# Patient Record
Sex: Male | Born: 1995 | Race: White | Hispanic: No | Marital: Married | Smoking: Current some day smoker
Health system: Southern US, Community
[De-identification: ages and names within clinical notes are randomized; demographics above are authoritative.]

## PROBLEM LIST (undated history)

## (undated) DIAGNOSIS — J45909 Unspecified asthma, uncomplicated: Secondary | ICD-10-CM

## (undated) DIAGNOSIS — R079 Chest pain, unspecified: Secondary | ICD-10-CM

## (undated) DIAGNOSIS — Z87442 Personal history of urinary calculi: Secondary | ICD-10-CM

## (undated) DIAGNOSIS — F419 Anxiety disorder, unspecified: Secondary | ICD-10-CM

## (undated) HISTORY — PX: TONSILLECTOMY AND ADENOIDECTOMY: SUR1326

## (undated) HISTORY — PX: TONSILLECTOMY: SUR1361

---
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
## 2016-01-22 ENCOUNTER — Emergency Department (HOSPITAL_COMMUNITY)
Admission: EM | Admit: 2016-01-22 | Discharge: 2016-01-22 | Disposition: A | Discharge status: Home / Self Care | Payer: Medicaid Other | Attending: Emergency Medicine | Admitting: Emergency Medicine

## 2016-01-22 ENCOUNTER — Encounter (HOSPITAL_COMMUNITY): Payer: Self-pay | Admitting: Emergency Medicine

## 2016-01-22 DIAGNOSIS — R062 Wheezing: Secondary | ICD-10-CM

## 2016-01-22 DIAGNOSIS — J069 Acute upper respiratory infection, unspecified: Secondary | ICD-10-CM | POA: Insufficient documentation

## 2016-01-22 DIAGNOSIS — F17200 Nicotine dependence, unspecified, uncomplicated: Secondary | ICD-10-CM | POA: Insufficient documentation

## 2016-01-22 DIAGNOSIS — R05 Cough: Secondary | ICD-10-CM | POA: Diagnosis present

## 2016-01-22 DIAGNOSIS — B9789 Other viral agents as the cause of diseases classified elsewhere: Secondary | ICD-10-CM

## 2016-01-22 MED ORDER — ALBUTEROL SULFATE HFA 108 (90 BASE) MCG/ACT IN AERS
2.0000 | INHALATION_SPRAY | Freq: Once | RESPIRATORY_TRACT | Status: AC
  Administered 2016-01-22: 2 via RESPIRATORY_TRACT
  Filled 2016-01-22: qty 6.7

## 2016-01-22 MED ORDER — BENZONATATE 100 MG PO CAPS
200.0000 mg | ORAL_CAPSULE | Freq: Once | ORAL | Status: AC
  Administered 2016-01-22: 200 mg via ORAL
  Filled 2016-01-22: qty 2

## 2016-01-22 MED ORDER — BENZONATATE 100 MG PO CAPS: 200.0000 mg | ORAL_CAPSULE | Freq: Three times a day (TID) | ORAL | Status: DC | PRN

## 2016-01-22 NOTE — Discharge Instructions (Signed)
Upper Respiratory Infection, Adult Most upper respiratory infections (URIs) are a viral infection of the air passages leading to the lungs. A URI affects the nose, throat, and upper air passages. The most common type of URI is nasopharyngitis and is typically referred to as "the common cold." URIs run their course and usually go away on their own. Most of the time, a URI does not require medical attention, but sometimes a bacterial infection in the upper airways can follow a viral infection. This is called a secondary infection. Sinus and middle ear infections are common types of secondary upper respiratory infections. Bacterial pneumonia can also complicate a URI. A URI can worsen asthma and chronic obstructive pulmonary disease (COPD). Sometimes, these complications can require emergency medical care and may be life threatening.  CAUSES Almost all URIs are caused by viruses. A virus is a type of germ and can spread from one person to another.  RISKS FACTORS You may be at risk for a URI if:   You smoke.   You have chronic heart or lung disease.  You have a weakened defense (immune) system.   You are very young or very old.   You have nasal allergies or asthma.  You work in crowded or poorly ventilated areas.  You work in health care facilities or schools. SIGNS AND SYMPTOMS  Symptoms typically develop 2-3 days after you come in contact with a cold virus. Most viral URIs last 7-10 days. However, viral URIs from the influenza virus (flu virus) can last 14-18 days and are typically more severe. Symptoms may include:   Runny or stuffy (congested) nose.   Sneezing.   Cough.   Sore throat.   Headache.   Fatigue.   Fever.   Loss of appetite.   Pain in your forehead, behind your eyes, and over your cheekbones (sinus pain).  Muscle aches.  DIAGNOSIS  Your health care provider may diagnose a URI by:  Physical exam.  Tests to check that your symptoms are not due to  another condition such as:  Strep throat.  Sinusitis.  Pneumonia.  Asthma. TREATMENT  A URI goes away on its own with time. It cannot be cured with medicines, but medicines may be prescribed or recommended to relieve symptoms. Medicines may help:  Reduce your fever.  Reduce your cough.  Relieve nasal congestion. HOME CARE INSTRUCTIONS   Take medicines only as directed by your health care provider.   Gargle warm saltwater or take cough drops to comfort your throat as directed by your health care provider.  Use a warm mist humidifier or inhale steam from a shower to increase air moisture. This may make it easier to breathe.  Drink enough fluid to keep your urine clear or pale yellow.   Eat soups and other clear broths and maintain good nutrition.   Rest as needed.   Return to work when your temperature has returned to normal or as your health care provider advises. You may need to stay home longer to avoid infecting others. You can also use a face mask and careful hand washing to prevent spread of the virus.  Increase the usage of your inhaler if you have asthma.   Do not use any tobacco products, including cigarettes, chewing tobacco, or electronic cigarettes. If you need help quitting, ask your health care provider. PREVENTION  The best way to protect yourself from getting a cold is to practice good hygiene.   Avoid oral or hand contact with people with cold  symptoms.   Wash your hands often if contact occurs.  There is no clear evidence that vitamin C, vitamin E, echinacea, or exercise reduces the chance of developing a cold. However, it is always recommended to get plenty of rest, exercise, and practice good nutrition.  SEEK MEDICAL CARE IF:   You are getting worse rather than better.   Your symptoms are not controlled by medicine.   You have chills.  You have worsening shortness of breath.  You have brown or red mucus.  You have yellow or brown nasal  discharge.  You have pain in your face, especially when you bend forward.  You have a fever.  You have swollen neck glands.  You have pain while swallowing.  You have white areas in the back of your throat. SEEK IMMEDIATE MEDICAL CARE IF:   You have severe or persistent:  Headache.  Ear pain.  Sinus pain.  Chest pain.  You have chronic lung disease and any of the following:  Wheezing.  Prolonged cough.  Coughing up blood.  A change in your usual mucus.  You have a stiff neck.  You have changes in your:  Vision.  Hearing.  Thinking.  Mood. MAKE SURE YOU:   Understand these instructions.  Will watch your condition.  Will get help right away if you are not doing well or get worse.   This information is not intended to replace advice given to you by your health care provider. Make sure you discuss any questions you have with your health care provider.   Document Released: 04/02/2001 Document Revised: 02/21/2015 Document Reviewed: 01/12/2014 Elsevier Interactive Patient Education [employer]2016 [employer]    As discussed, you may use 2 puffs of your inhaler every 4 hours if you are coughing, short of breath or wheezing.

## 2016-01-22 NOTE — ED Notes (Signed)
Pt states he has been having productive cough x one week.

## 2016-01-24 NOTE — ED Provider Notes (Signed)
CSN: [id number]     Arrival date & time 01/22/16  2044 History   First MD Initiated Contact with Patient 01/22/16 2109     Chief Complaint  Patient presents with  . Cough     (Consider location/radiation/quality/duration/timing/severity/associated sxs/prior Treatment) The history is provided by the patient.   Russell Huynh is a 20 y.o. male presenting with a 5 day history of uri type symptoms which includes nasal congestion with clear rhinorrhea, post nasal drip and cough which has been productive of clear sputum.  Symptoms do not include shortness of breath, chest pain, nausea, vomiting or diarrhea. He denies fevers or chills.  His cough has been worse at night, making it difficult to sleep and is frequently triggered by a dry tickle sensation in his check.  He denies post tussive emesis.  The patient has taken cough drops and otc cough syrups with no relief.      Past Medical History  Diagnosis Date  . Chest pain    Past Surgical History  Procedure Laterality Date  . Tonsillectomy     No family history on file. Social History  Substance Use Topics  . Smoking status: Current Some Day Smoker  . Smokeless tobacco: None  . Alcohol Use: No    Review of Systems  Constitutional: Negative for fever and chills.  HENT: Positive for congestion and rhinorrhea. Negative for ear pain, sinus pressure, sore throat, trouble swallowing and voice change.   Eyes: Negative for discharge.  Respiratory: Positive for cough. Negative for shortness of breath, wheezing and stridor.   Cardiovascular: Negative for chest pain.  Gastrointestinal: Negative for abdominal pain.  Genitourinary: Negative.       Allergies  Pollen extract  Home Medications   Prior to Admission medications   Medication Sig Start Date End Date Taking? Authorizing Provider  acetaminophen-codeine (TYLENOL #3) 300-30 MG per tablet Take 1 tablet by mouth every 6 (six) hours as needed for moderate pain. 10/03/13   Burgess AmorJulie Lessa Huge,  PA-C  benzonatate (TESSALON) 100 MG capsule Take 2 capsules (200 mg total) by mouth 3 (three) times daily as needed. 01/22/16   Burgess AmorJulie Jule Schlabach, PA-C  ibuprofen (ADVIL,MOTRIN) 600 MG tablet Take 1 tablet (600 mg total) by mouth every 6 (six) hours as needed. 12/14/14   Loren Raceravid Yelverton, MD  oxyCODONE-acetaminophen (PERCOCET) 5-325 MG per tablet Take 2 tablets by mouth every 6 (six) hours as needed for severe pain. 10/30/13   Wayland SalinasJohn Bednar, MD   BP 118/74 mmHg  Pulse 82  Temp(Src) 97.9 F (36.6 C) (Oral)  Resp 20  Ht 5\' 9"  (1.753 m)  Wt 58.968 kg  BMI 19.19 kg/m2  SpO2 98% Physical Exam  Constitutional: He is oriented to person, place, and time. He appears well-developed and well-nourished.  HENT:  Head: Normocephalic and atraumatic.  Right Ear: Tympanic membrane and ear canal normal.  Left Ear: Tympanic membrane and ear canal normal.  Nose: Rhinorrhea present. No mucosal edema.  Mouth/Throat: Uvula is midline, oropharynx is clear and moist and mucous membranes are normal. No oropharyngeal exudate, posterior oropharyngeal edema, posterior oropharyngeal erythema or tonsillar abscesses.  Eyes: Conjunctivae are normal.  Cardiovascular: Normal rate, regular rhythm and normal heart sounds.   Pulmonary/Chest: Effort normal. No respiratory distress. He has wheezes in the right lower field and the left lower field. He has no rales.  Expiratory wheeze at bases, clear with cough.  Abdominal: Soft. There is no tenderness.  Musculoskeletal: Normal range of motion.  Neurological: He is alert and oriented  to person, place, and time.  Skin: Skin is warm and dry. No rash noted.  Psychiatric: He has a normal mood and affect.    ED Course  Procedures (including critical care time) Labs Review Labs Reviewed - No data to display  Imaging Review No results found. I have personally reviewed and evaluated these images and lab results as part of my medical decision-making.   EKG Interpretation None       MDM   Final diagnoses:  Viral URI with cough  Wheezing    Pt given tessalon and albuterol mdi while here.  Cough improved with no wheeze at recheck exam, lungs are clear at repeat exam, no indication for imaging at this time.  Prescribed tessalon and pt will take mdi home with spacer. Advised rest, increased fluids, prn f/u with pcp if sx persist or worsen.    Burgess Amor, PA-C 01/24/16 2322  Marily Memos, MD 01/25/16 [phone]

## 2018-05-14 ENCOUNTER — Emergency Department (HOSPITAL_COMMUNITY): Payer: Self-pay

## 2018-05-14 ENCOUNTER — Encounter (HOSPITAL_COMMUNITY): Payer: Self-pay

## 2018-05-14 ENCOUNTER — Emergency Department (HOSPITAL_COMMUNITY)
Admission: EM | Admit: 2018-05-14 | Discharge: 2018-05-14 | Disposition: A | Discharge status: Home / Self Care | Payer: Self-pay | Attending: Emergency Medicine | Admitting: Emergency Medicine

## 2018-05-14 ENCOUNTER — Other Ambulatory Visit: Payer: Self-pay

## 2018-05-14 DIAGNOSIS — F17200 Nicotine dependence, unspecified, uncomplicated: Secondary | ICD-10-CM | POA: Insufficient documentation

## 2018-05-14 DIAGNOSIS — K529 Noninfective gastroenteritis and colitis, unspecified: Secondary | ICD-10-CM

## 2018-05-14 DIAGNOSIS — K5289 Other specified noninfective gastroenteritis and colitis: Secondary | ICD-10-CM | POA: Insufficient documentation

## 2018-05-14 LAB — CBC WITH DIFFERENTIAL/PLATELET
BASOS PCT: 0 %
Basophils Absolute: 0 10*3/uL (ref 0.0–0.1)
EOS ABS: 0.3 10*3/uL (ref 0.0–0.7)
Eosinophils Relative: 3 %
HEMATOCRIT: 47.8 % (ref 39.0–52.0)
Hemoglobin: 16.9 g/dL (ref 13.0–17.0)
Lymphocytes Relative: 29 %
Lymphs Abs: 2.6 10*3/uL (ref 0.7–4.0)
MCH: 32 pg (ref 26.0–34.0)
MCHC: 35.4 g/dL (ref 30.0–36.0)
MCV: 90.5 fL (ref 78.0–100.0)
MONO ABS: 0.5 10*3/uL (ref 0.1–1.0)
MONOS PCT: 5 %
NEUTROS ABS: 5.7 10*3/uL (ref 1.7–7.7)
Neutrophils Relative %: 63 %
Platelets: 144 10*3/uL — ABNORMAL LOW (ref 150–400)
RBC: 5.28 MIL/uL (ref 4.22–5.81)
RDW: 12.1 % (ref 11.5–15.5)
WBC: 9.1 10*3/uL (ref 4.0–10.5)

## 2018-05-14 LAB — COMPREHENSIVE METABOLIC PANEL
ALBUMIN: 4.3 g/dL (ref 3.5–5.0)
ALT: 13 U/L (ref 0–44)
ANION GAP: 8 (ref 5–15)
AST: 17 U/L (ref 15–41)
Alkaline Phosphatase: 56 U/L (ref 38–126)
BUN: 12 mg/dL (ref 6–20)
CALCIUM: 9.2 mg/dL (ref 8.9–10.3)
CO2: 27 mmol/L (ref 22–32)
Chloride: 110 mmol/L (ref 98–111)
Creatinine, Ser: 0.81 mg/dL (ref 0.61–1.24)
GFR calc Af Amer: >60 mL/min (ref >60)
GFR calc non Af Amer: >60 mL/min (ref >60)
GLUCOSE: 88 mg/dL (ref 70–99)
Potassium: 4 mmol/L (ref 3.5–5.1)
Sodium: 145 mmol/L (ref 135–145)
TOTAL PROTEIN: 7.1 g/dL (ref 6.5–8.1)
Total Bilirubin: 0.6 mg/dL (ref 0.3–1.2)

## 2018-05-14 LAB — LIPASE, BLOOD: Lipase: 32 U/L (ref 11–51)

## 2018-05-14 MED ORDER — METRONIDAZOLE 500 MG PO TABS
500.0000 mg | ORAL_TABLET | Freq: Once | ORAL | Status: AC
  Administered 2018-05-14: 500 mg via ORAL
  Filled 2018-05-14: qty 1

## 2018-05-14 MED ORDER — METRONIDAZOLE 500 MG PO TABS: 500.0000 mg | ORAL_TABLET | Freq: Two times a day (BID) | ORAL | 0 refills | Status: AC

## 2018-05-14 MED ORDER — CIPROFLOXACIN HCL 250 MG PO TABS
500.0000 mg | ORAL_TABLET | Freq: Once | ORAL | Status: AC
  Administered 2018-05-14: 500 mg via ORAL
  Filled 2018-05-14: qty 2

## 2018-05-14 MED ORDER — MELOXICAM 7.5 MG PO TABS: 15.0000 mg | ORAL_TABLET | Freq: Every day | ORAL | 0 refills | Status: DC

## 2018-05-14 MED ORDER — CIPROFLOXACIN HCL 500 MG PO TABS: 500.0000 mg | ORAL_TABLET | Freq: Two times a day (BID) | ORAL | 0 refills | Status: DC

## 2018-05-14 MED ORDER — IOPAMIDOL (ISOVUE-300) INJECTION 61%
100.0000 mL | Freq: Once | INTRAVENOUS | Status: AC | PRN
  Administered 2018-05-14: 100 mL via INTRAVENOUS

## 2018-05-14 MED ORDER — MORPHINE SULFATE (PF) 4 MG/ML IV SOLN
4.0000 mg | Freq: Once | INTRAVENOUS | Status: AC
  Administered 2018-05-14: 4 mg via INTRAVENOUS
  Filled 2018-05-14: qty 1

## 2018-05-14 NOTE — ED Triage Notes (Signed)
Pt is having lower right abdominal pain that has been hurting for 2 days. States he is having diarrhea that started today as well. Is having rebound tenderness. No fever or chills.

## 2018-05-14 NOTE — Discharge Instructions (Signed)
Your CT scan shows you have mild colitis - bowel inflammation - this can sometimes be related to a viral or bacterial infection.  Ciprofloxacin twice a day for 10 days, Flagyl twice a day for 10 days, Mobic as needed for pain  you will likely have ongoing diarrhea and discomfort for the next several days to a week.  Drink plenty of fluids to avoid getting dehydrated.  Emergency department for severe or worsening symptoms.

## 2018-05-14 NOTE — ED Provider Notes (Signed)
[facility] EMERGENCY DEPARTMENT Provider Note   CSN: [id number] Arrival date & time: 05/14/18  4098     History   Chief Complaint Chief Complaint  Patient presents with  . Abdominal Pain    HPI Russell Huynh is a 22 y.o. male.  HPI  The patient is a 22 year old male, he presents today with a complaint of right mid abdominal pain.  He states that this has been going on for approximately 5 days, it started on Sunday, it was gradual in onset but has been persistent throughout each of the last 5 days.  He notes it most intense in the morning when he gets up and gradually gets better throughout the day but is still persistent.  He states that when he is doing something like his job it does not bother him that much but when he stops doing something he notices the pain.  The pain is more on the right mid abdomen, sometimes lower, sometimes upper but is not associated with fevers, not associated with nausea and only this morning was it associated with changes in bowel habits with diarrhea which she describes as both loose and watery but nonbloody.  He has never had any abdominal pain, he has been eating and drinking per normal and has no postprandial pain.  The pain is never on the left side, there is no coughing shortness of breath or chest discomfort and no difficulty with urination including hematuria dysuria or frequency.  The patient has never had any pains like this in the past.  Past Medical History:  Diagnosis Date  . Chest pain     Patient Active Problem List   Diagnosis Date Noted  . Muscle weakness (generalized) 03/10/2014  . Pain in joint, lower leg 03/10/2014  . Stiffness of joint, not elsewhere classified, lower leg 03/10/2014  . Knee pain, bilateral 09/30/2013    Past Surgical History:  Procedure Laterality Date  . TONSILLECTOMY          Home Medications    Prior to Admission medications   Medication Sig Start Date End Date Taking? Authorizing Provider    acetaminophen-codeine (TYLENOL #3) 300-30 MG per tablet Take 1 tablet by mouth every 6 (six) hours as needed for moderate pain. 10/03/13   Burgess Amor, PA-C  benzonatate (TESSALON) 100 MG capsule Take 2 capsules (200 mg total) by mouth 3 (three) times daily as needed. 01/22/16   Burgess Amor, PA-C  ciprofloxacin (CIPRO) 500 MG tablet Take 1 tablet (500 mg total) by mouth every 12 (twelve) hours. 05/14/18   Eber Hong, MD  ibuprofen (ADVIL,MOTRIN) 600 MG tablet Take 1 tablet (600 mg total) by mouth every 6 (six) hours as needed. 12/14/14   [name], MD  meloxicam (MOBIC) 7.5 MG tablet Take 2 tablets (15 mg total) by mouth daily. 05/14/18   Eber Hong, MD  metroNIDAZOLE (FLAGYL) 500 MG tablet Take 1 tablet (500 mg total) by mouth 2 (two) times daily for 10 days. 05/14/18 05/24/18  Eber Hong, MD  oxyCODONE-acetaminophen (PERCOCET) 5-325 MG per tablet Take 2 tablets by mouth every 6 (six) hours as needed for severe pain. 10/30/13   Wayland Salinas, MD    Family History No family history on file.  Social History Social History   Tobacco Use  . Smoking status: Current Some Day Smoker  Substance Use Topics  . Alcohol use: No  . Drug use: No     Allergies   Pollen extract   Review of Systems Review of Systems  All other systems reviewed and are negative.    Physical Exam Updated Vital Signs BP 122/85 (BP Location: Right Arm)   Pulse 66   Temp 97.8 F (36.6 C) (Oral)   Resp 14   Wt 58.1 kg (128 lb)   SpO2 100%   BMI 18.90 kg/m   Physical Exam  Constitutional: He appears well-developed and well-nourished. No distress.  HENT:  Head: Normocephalic and atraumatic.  Mouth/Throat: Oropharynx is clear and moist. No oropharyngeal exudate.  Eyes: Pupils are equal, round, and reactive to light. Conjunctivae and EOM are normal. Right eye exhibits no discharge. Left eye exhibits no discharge. No scleral icterus.  Neck: Normal range of motion. Neck supple. No JVD present. No  thyromegaly present.  Cardiovascular: Normal rate, regular rhythm, normal heart sounds and intact distal pulses. Exam reveals no gallop and no friction rub.  No murmur heard. Pulmonary/Chest: Effort normal and breath sounds normal. No respiratory distress. He has no wheezes. He has no rales.  Abdominal: Soft. Bowel sounds are normal. He exhibits no distension and no mass. There is tenderness ( Tenderness present in the right upper and right mid abdomen, minimal tenderness in the right lower quadrant, no tenderness on the left).  Musculoskeletal: Normal range of motion. He exhibits no edema or tenderness.  Lymphadenopathy:    He has no cervical adenopathy.  Neurological: He is alert. Coordination normal.  Skin: Skin is warm and dry. No rash noted. No erythema.  Psychiatric: He has a normal mood and affect. His behavior is normal.  Nursing note and vitals reviewed.    ED Treatments / Results  Labs (all labs ordered are listed, but only abnormal results are displayed) Labs Reviewed  CBC WITH DIFFERENTIAL/PLATELET - Abnormal; Notable for the following components:      Result Value   Platelets 144 (*)    All other components within normal limits  COMPREHENSIVE METABOLIC PANEL  LIPASE, BLOOD  URINALYSIS, ROUTINE W REFLEX MICROSCOPIC    EKG None  Radiology Ct Abdomen Pelvis W Contrast  Result Date: 05/14/2018 CLINICAL DATA:  22 year old male with right lower abdominal pain for 2 days. Diarrhea beginning today. EXAM: CT ABDOMEN AND PELVIS WITH CONTRAST TECHNIQUE: Multidetector CT imaging of the abdomen and pelvis was performed using the standard protocol following bolus administration of intravenous contrast. CONTRAST:  ISOVUE-300 IOPAMIDOL (ISOVUE-300) INJECTION 61% COMPARISON:  None. FINDINGS: Lower chest: Normal lung bases.  No pericardial or pleural effusion. Hepatobiliary: Negative liver and gallbladder. Pancreas: Negative. Spleen: Negative. Adrenals/Urinary Tract: Normal adrenal  glands. Bilateral renal enhancement appears normal. There is evidence of punctate nephrolithiasis in the right renal lower pole on coronal image 49. The right renal collecting system is also mildly prominent, but the proximal right ureter is decompressed. Diminutive and unremarkable urinary bladder. Stomach/Bowel: The mucosa of the rectum and sigmoid colon appear to be hyperenhancing (series 2, image 67). No rectal wall thickening. Mild thickening of the wall of the proximal sigmoid. Mucosa throughout much of the descending colon also appears to be hyperenhancing, the splenic flexure appears spared. No descending wall thickening. There is mild wall thickening and mucosal hyperenhancement throughout the transverse colon (series 2, image 43). The ascending colon has a more normal appearance, however, there is trace free fluid in the right gutter (series 2, image 56). The appendix is demonstrated on axial images 52-55 and on coronal images thirty-six through 38. The diameter at the base of the appendix is at the upper limits of normal to mildly increased at 7  millimeters, and there appears to be some appendiceal mucosal hyperenhancement in this region. However, the tip of the appendix is normal (series 2, image 52), and there is no periappendiceal mesenteric stranding. The terminal ileum and small bowel appear within normal limits. No dilated small bowel. Negative stomach and duodenum. No abdominal free air. Vascular/Lymphatic: The major arterial structures are patent and normal. Patent portal venous system. No lymphadenopathy. Reproductive: Negative. Other: Small volume of abnormal simple appearing pelvic free fluid (series 2, image 66). This is located along the bilateral distal sigmoid colon. Musculoskeletal: Mild levoconvex lower thoracic and lumbar spine curvature. No other osseous abnormality. IMPRESSION: 1. Small volume of abnormal but nonspecific simple appearing free fluid in the pelvis and right gutter. Mild  wall thickening and mucosal hyperenhancement intermittently throughout much of the large bowel, and although the base of the appendix may be affected, the appendix tip is normal. The constellation is most compatible with Mild Acute Colitis. No obstruction or complicating features. 2. Possible punctate right nephrolithiasis. Electronically Signed   By: Odessa FlemingH  Hall M.D.   On: 05/14/2018 08:43    Procedures Procedures (including critical care time)  Medications Ordered in ED Medications  ciprofloxacin (CIPRO) tablet 500 mg (has no administration in time range)  metroNIDAZOLE (FLAGYL) tablet 500 mg (has no administration in time range)  morphine 4 MG/ML injection 4 mg (4 mg Intravenous Given 05/14/18 0806)  iopamidol (ISOVUE-300) 61 % injection 100 mL (100 mLs Intravenous Contrast Given 05/14/18 0827)     Initial Impression / Assessment and Plan / ED Course  I have reviewed the triage vital signs and the nursing notes.  Pertinent labs & imaging results that were available during my care of the patient were reviewed by me and considered in my medical decision making (see chart for details).  Clinical Course as of May 15 911  Thu May 14, 2018  0909 CT scan shows that the patient has mild colitis, there is no leukocytosis, no signs of appendicitis, liver function, renal function are both normal.  Vital signs are also normal, the patient will be treated for an acute infectious colitis.  He is agreeable.  He has a stable abdomen and does not need a surgical consultation at this time.   [BM]    Clinical Course User Index [BM] Eber HongMiller, Keaunna Skipper, MD   The patient's exam is unremarkable except for that focal right-sided tenderness.  This prompted an evaluation for colitis given his diarrhea, appendicitis given the location of the pain, less likely to be gallbladder given no jaundice or postprandial pain.  Pancreatitis would also be an option though this is not classic.  Patient will undergo laboratory work-up,  pain medication and a CT scan.  Final Clinical Impressions(s) / ED Diagnoses   Final diagnoses:  Colitis    ED Discharge Orders        Ordered    ciprofloxacin (CIPRO) 500 MG tablet  Every 12 hours     05/14/18 0911    metroNIDAZOLE (FLAGYL) 500 MG tablet  2 times daily     05/14/18 0911    meloxicam (MOBIC) 7.5 MG tablet  Daily     05/14/18 0911       Eber HongMiller, Salil Raineri, MD 05/14/18 [phone]

## 2018-05-14 NOTE — ED Notes (Signed)
Patient transported to CT 

## 2018-12-08 ENCOUNTER — Encounter (HOSPITAL_COMMUNITY): Payer: Self-pay | Admitting: *Deleted

## 2018-12-08 ENCOUNTER — Emergency Department (HOSPITAL_COMMUNITY)
Admission: EM | Admit: 2018-12-08 | Discharge: 2018-12-08 | Disposition: A | Discharge status: Home / Self Care | Payer: Self-pay | Attending: Emergency Medicine | Admitting: Emergency Medicine

## 2018-12-08 ENCOUNTER — Other Ambulatory Visit: Payer: Self-pay

## 2018-12-08 DIAGNOSIS — J111 Influenza due to unidentified influenza virus with other respiratory manifestations: Secondary | ICD-10-CM | POA: Insufficient documentation

## 2018-12-08 DIAGNOSIS — F17210 Nicotine dependence, cigarettes, uncomplicated: Secondary | ICD-10-CM | POA: Insufficient documentation

## 2018-12-08 NOTE — ED Notes (Signed)
ED Provider at bedside. 

## 2018-12-08 NOTE — ED Provider Notes (Signed)
[facility] EMERGENCY DEPARTMENT Provider Note   CSN: [id number] Arrival date & time: 12/08/18  1910    History   Chief Complaint Chief Complaint  Patient presents with  . Fever    HPI Russell Huynh is a 23 y.o. male.     Temp max 102.  The history is provided by the patient.  URI  Presenting symptoms: cough, fever and sore throat   Presenting symptoms: no congestion   Severity:  Moderate Onset quality:  Gradual Duration:  4 days Timing:  Constant Progression:  Worsening Chronicity:  New Relieved by:  Nothing Worsened by:  Nothing Ineffective treatments:  OTC medications (tylenol) Associated symptoms: headaches and myalgias   Associated symptoms: no arthralgias, no neck pain and no wheezing   Risk factors: no recent travel and no sick contacts     Past Medical History:  Diagnosis Date  . Chest pain     Patient Active Problem List   Diagnosis Date Noted  . Muscle weakness (generalized) 03/10/2014  . Pain in joint, lower leg 03/10/2014  . Stiffness of joint, not elsewhere classified, lower leg 03/10/2014  . Knee pain, bilateral 09/30/2013    Past Surgical History:  Procedure Laterality Date  . TONSILLECTOMY          Home Medications    Prior to Admission medications   Medication Sig Start Date End Date Taking? Authorizing Provider  acetaminophen-codeine (TYLENOL #3) 300-30 MG per tablet Take 1 tablet by mouth every 6 (six) hours as needed for moderate pain. 10/03/13   Burgess Amor, PA-C  benzonatate (TESSALON) 100 MG capsule Take 2 capsules (200 mg total) by mouth 3 (three) times daily as needed. 01/22/16   Burgess Amor, PA-C  ciprofloxacin (CIPRO) 500 MG tablet Take 1 tablet (500 mg total) by mouth every 12 (twelve) hours. 05/14/18   Eber Hong, MD  ibuprofen (ADVIL,MOTRIN) 600 MG tablet Take 1 tablet (600 mg total) by mouth every 6 (six) hours as needed. 12/14/14   [name], MD  meloxicam (MOBIC) 7.5 MG tablet Take 2 tablets (15 mg total) by  mouth daily. 05/14/18   Eber Hong, MD  oxyCODONE-acetaminophen (PERCOCET) 5-325 MG per tablet Take 2 tablets by mouth every 6 (six) hours as needed for severe pain. 10/30/13   Wayland Salinas, MD    Family History History reviewed. No pertinent family history.  Social History Social History   Tobacco Use  . Smoking status: Current Some Day Smoker    Packs/day: 0.50  . Smokeless tobacco: Never Used  Substance Use Topics  . Alcohol use: No  . Drug use: No     Allergies   Pollen extract   Review of Systems Review of Systems  Constitutional: Positive for fever. Negative for activity change.       All ROS Neg except as noted in HPI  HENT: Positive for sore throat. Negative for congestion and nosebleeds.   Eyes: Negative for photophobia and discharge.  Respiratory: Positive for cough. Negative for shortness of breath and wheezing.   Cardiovascular: Negative for chest pain and palpitations.  Gastrointestinal: Negative for abdominal pain and blood in stool.  Genitourinary: Negative for dysuria, frequency and hematuria.  Musculoskeletal: Positive for myalgias. Negative for arthralgias, back pain and neck pain.  Skin: Negative.   Neurological: Positive for headaches. Negative for dizziness, seizures and speech difficulty.  Psychiatric/Behavioral: Negative for confusion and hallucinations.     Physical Exam Updated Vital Signs BP 111/70 (BP Location: Right Arm)   Pulse 82  Temp 98.2 F (36.8 C) (Oral)   Resp 16   Ht 5\' 10"  (1.778 m)   Wt 61.2 kg   SpO2 100%   BMI 19.37 kg/m   Physical Exam Vitals signs and nursing note reviewed.  Constitutional:      Appearance: He is well-developed. He is not toxic-appearing.  HENT:     Head: Normocephalic.     Right Ear: Tympanic membrane and external ear normal.     Left Ear: Tympanic membrane and external ear normal.     Nose: Congestion present.  Eyes:     General: Lids are normal.     Pupils: Pupils are equal, round, and  reactive to light.  Neck:     Musculoskeletal: Normal range of motion and neck supple.     Vascular: No carotid bruit.  Cardiovascular:     Rate and Rhythm: Normal rate and regular rhythm.     Pulses: Normal pulses.     Heart sounds: Normal heart sounds.  Pulmonary:     Effort: No respiratory distress.     Breath sounds: Normal breath sounds.  Abdominal:     General: Bowel sounds are normal.     Palpations: Abdomen is soft.     Tenderness: There is no abdominal tenderness. There is no guarding.  Musculoskeletal: Normal range of motion.  Lymphadenopathy:     Head:     Right side of head: No submandibular adenopathy.     Left side of head: No submandibular adenopathy.     Cervical: No cervical adenopathy.  Skin:    General: Skin is warm and dry.  Neurological:     Mental Status: He is alert and oriented to person, place, and time.     Cranial Nerves: No cranial nerve deficit.     Sensory: No sensory deficit.  Psychiatric:        Speech: Speech normal.      ED Treatments / Results  Labs (all labs ordered are listed, but only abnormal results are displayed) Labs Reviewed - No data to display  EKG None  Radiology No results found.  Procedures Procedures (including critical care time)  Medications Ordered in ED Medications - No data to display   Initial Impression / Assessment and Plan / ED Course  I have reviewed the triage vital signs and the nursing notes.  Pertinent labs & imaging results that were available during my care of the patient were reviewed by me and considered in my medical decision making (see chart for details).         Final Clinical Impressions(s) / ED Diagnoses MDM  Vital signs reviewed.  Pulse oximetry is 100% on room air.  Within normal limits by my interpretation.  The history and the examination favor influenza.  Patient seems to be improving at this point.  Patient was given a mask.  He was asked to increase fluids.  He was advised  to wash hands frequently, and have all members of the family wash hands frequently.  He was asked to use Tylenol every 4 hours or ibuprofen every 6 hours for fever or aching.  Patient acknowledges understanding of these instructions.   Final diagnoses:  Influenza    ED Discharge Orders    None       Ivery Quale, Cordelia Poche 12/08/18 2137    Jacalyn Lefevre, MD 12/08/18 [phone]

## 2018-12-08 NOTE — Discharge Instructions (Addendum)
Your history and your examination suggest influenza.  Please wash hands frequently.  Please have everyone in your home wash hands frequently.  Please increase fluids.  Use Tylenol every 4 hours or ibuprofen every 6 hours for fever, and/or aching.  Please use the decongesting medication such as Sudafed of your choice for the congestion and coughing.  Please see your primary physician or return to the emergency department if any changes in your condition, problems, or concerns.

## 2018-12-08 NOTE — ED Triage Notes (Signed)
Pt c/o fever with chills and cough x 4 days and body aches

## 2019-07-13 ENCOUNTER — Other Ambulatory Visit: Payer: Self-pay

## 2019-07-13 ENCOUNTER — Ambulatory Visit
Admission: EM | Admit: 2019-07-13 | Discharge: 2019-07-13 | Disposition: A | Discharge status: Home / Self Care | Payer: Self-pay

## 2019-07-13 DIAGNOSIS — M791 Myalgia, unspecified site: Secondary | ICD-10-CM

## 2019-07-13 DIAGNOSIS — Z20822 Contact with and (suspected) exposure to covid-19: Secondary | ICD-10-CM

## 2019-07-13 DIAGNOSIS — R509 Fever, unspecified: Secondary | ICD-10-CM

## 2019-07-13 DIAGNOSIS — R112 Nausea with vomiting, unspecified: Secondary | ICD-10-CM

## 2019-07-13 DIAGNOSIS — R197 Diarrhea, unspecified: Secondary | ICD-10-CM

## 2019-07-13 NOTE — ED Triage Notes (Signed)
Pt presents to UC w/ fever, nausea, vomiting, chills, and body aches 1 week ago x3 days. Pt states he is not having any symptoms at this time. Pt has been in self-quarantine and would like a note from 7 days ago to today.

## 2019-07-13 NOTE — ED Provider Notes (Signed)
[facility]   [phone] 07/13/19 Arrival Time: 1022   CC: flu-like symptoms  SUBJECTIVE: History from: patient.  Russell Huynh is a 23 y.o. male who presents with fever (tmax of 102), nausea, vomiting with 10-15 episodes, diarrhea x 2 episodes chills, and body aches that began on 07/06/2019 and lasted until the 07/08/2019.  Now resolved.  Denies sick exposure to COVID, flu or strep.  Denies recent travel. Works at [employer] in [place].  Denies aggravating or alleviating factors. Denies previous COVID infection.  Denies sinus pain, rhinorrhea, sore throat, SOB, wheezing, chest pain, changes in bladder habits.     ROS: As per HPI.  All other pertinent ROS negative.     Past Medical History:  Diagnosis Date  . Chest pain    Past Surgical History:  Procedure Laterality Date  . TONSILLECTOMY    . TONSILLECTOMY AND ADENOIDECTOMY     Allergies  Allergen Reactions  . Pollen Extract    No current facility-administered medications on file prior to encounter.    Current Outpatient Medications on File Prior to Encounter  Medication Sig Dispense Refill  . acetaminophen-codeine (TYLENOL #3) 300-30 MG per tablet Take 1 tablet by mouth every 6 (six) hours as needed for moderate pain. 12 tablet 0  . ibuprofen (ADVIL,MOTRIN) 600 MG tablet Take 1 tablet (600 mg total) by mouth every 6 (six) hours as needed. 30 tablet 0   Social History   Socioeconomic History  . Marital status: Single    Spouse name: Not on file  . Number of children: Not on file  . Years of education: Not on file  . Highest education level: Not on file  Occupational History  . Not on file  Social Needs  . Financial resource strain: Not on file  . Food insecurity    Worry: Not on file    Inability: Not on file  . Transportation needs    Medical: Not on file    Non-medical: Not on file  Tobacco Use  . Smoking status: Current Some Day Smoker    Packs/day: 1.00  . Smokeless tobacco: Never Used   Substance and Sexual Activity  . Alcohol use: Yes    Comment: occasionally  . Drug use: No  . Sexual activity: Not on file  Lifestyle  . Physical activity    Days per week: Not on file    Minutes per session: Not on file  . Stress: Not on file  Relationships  . Social Musician on phone: Not on file    Gets together: Not on file    Attends religious service: Not on file    Active member of club or organization: Not on file    Attends meetings of clubs or organizations: Not on file    Relationship status: Not on file  . Intimate partner violence    Fear of current or ex partner: Not on file    Emotionally abused: Not on file    Physically abused: Not on file    Forced sexual activity: Not on file  Other Topics Concern  . Not on file  Social History Narrative  . Not on file   Family History  Problem Relation Age of Onset  . Diabetes Mother   . Hypertension Mother   . Healthy Father     OBJECTIVE:  Vitals:   07/13/19 1034  BP: (!) 144/72  Pulse: 87  Resp: 16  Temp: 98.6 F (37 C)  TempSrc:  Oral  SpO2: 98%     General appearance: alert; well-appearing, nontoxic; speaking in full sentences and tolerating own secretions HEENT: NCAT; Ears: EACs clear, TMs pearly gray; Eyes: PERRL. EOM grossly intact. Nose: nares patent without rhinorrhea, Throat: oropharynx clear, tonsils non erythematous or enlarged, uvula midline  Neck: supple without LAD Lungs: unlabored respirations, symmetrical air entry; cough: absent; no respiratory distress; CTAB Heart: regular rate and rhythm.  Radial pulses 2+ symmetrical bilaterally Abdomen: soft, nondistended, normal active bowel sounds; nontender to palpation; no guarding  Skin: warm and dry Psychological: alert and cooperative; normal mood and affect  ASSESSMENT & PLAN:  1. Suspected Covid-19 Virus Infection    Declines COVID testing today  In the meantime: You should remain isolated in your home for 10 days from  symptom onset AND greater than 72 hours after symptoms resolution (absence of fever without the use of fever-reducing medication and improvement in respiratory symptoms), whichever is longer Get plenty of rest and push fluids Use OTC medications like ibuprofen or tylenol as needed fever or pain Call or go to the ED if you have any new or worsening symptoms such as fever, worsening cough, shortness of breath, chest tightness, chest pain, turning blue, changes in mental status, etc...   Reviewed expectations re: course of current medical issues. Questions answered. Outlined signs and symptoms indicating need for more acute intervention. Patient verbalized understanding. After Visit Summary given.         Lestine Box, PA-C 07/13/19 1058

## 2019-07-13 NOTE — Discharge Instructions (Signed)
Declines COVID testing today  In the meantime: You should remain isolated in your home for 10 days from symptom onset AND greater than 72 hours after symptoms resolution (absence of fever without the use of fever-reducing medication and improvement in respiratory symptoms), whichever is longer Get plenty of rest and push fluids Use OTC medications like ibuprofen or tylenol as needed fever or pain Call or go to the ED if you have any new or worsening symptoms such as fever, worsening cough, shortness of breath, chest tightness, chest pain, turning blue, changes in mental status, etc..Marland Kitchen

## 2019-10-13 ENCOUNTER — Ambulatory Visit
Admission: EM | Admit: 2019-10-13 | Discharge: 2019-10-13 | Disposition: A | Discharge status: Home / Self Care | Payer: Medicaid Other | Attending: Urgent Care | Admitting: Urgent Care

## 2019-10-13 ENCOUNTER — Other Ambulatory Visit: Payer: Self-pay

## 2019-10-13 DIAGNOSIS — B349 Viral infection, unspecified: Secondary | ICD-10-CM

## 2019-10-13 DIAGNOSIS — R05 Cough: Secondary | ICD-10-CM

## 2019-10-13 DIAGNOSIS — F17210 Nicotine dependence, cigarettes, uncomplicated: Secondary | ICD-10-CM

## 2019-10-13 DIAGNOSIS — J3489 Other specified disorders of nose and nasal sinuses: Secondary | ICD-10-CM

## 2019-10-13 DIAGNOSIS — R059 Cough, unspecified: Secondary | ICD-10-CM

## 2019-10-13 DIAGNOSIS — Z720 Tobacco use: Secondary | ICD-10-CM

## 2019-10-13 DIAGNOSIS — R5381 Other malaise: Secondary | ICD-10-CM

## 2019-10-13 DIAGNOSIS — Z20828 Contact with and (suspected) exposure to other viral communicable diseases: Secondary | ICD-10-CM | POA: Diagnosis not present

## 2019-10-13 DIAGNOSIS — Z20822 Contact with and (suspected) exposure to covid-19: Secondary | ICD-10-CM

## 2019-10-13 MED ORDER — PROMETHAZINE-DM 6.25-15 MG/5ML PO SYRP: 5.0000 mL | ORAL_SOLUTION | Freq: Every evening | ORAL | 0 refills | Status: DC | PRN

## 2019-10-13 MED ORDER — BENZONATATE 100 MG PO CAPS: 100.0000 mg | ORAL_CAPSULE | Freq: Three times a day (TID) | ORAL | 0 refills | Status: DC | PRN

## 2019-10-13 NOTE — ED Triage Notes (Signed)
Pt presents to UC w/ c/o cough, runny nose x1 week.

## 2019-10-13 NOTE — Discharge Instructions (Addendum)

## 2019-10-13 NOTE — ED Provider Notes (Signed)
[facility]     MRN: [id number] DOB: 1996-01-02  Subjective:   Russell Huynh is a 23 y.o. male presenting for 1 week hx acute onset moderate malaise including runny and stuffy nose, mild-moderate persistent productive cough. Has tried NyQuil, DayQuil. Smokes ~1/2 ppd. Denies hx of asthma, allergies.  Denies COVID 19 contacts.   No current facility-administered medications for this encounter.  Current Outpatient Medications:  .  acetaminophen-codeine (TYLENOL #3) 300-30 MG per tablet, Take 1 tablet by mouth every 6 (six) hours as needed for moderate pain., Disp: 12 tablet, Rfl: 0 .  ibuprofen (ADVIL,MOTRIN) 600 MG tablet, Take 1 tablet (600 mg total) by mouth every 6 (six) hours as needed., Disp: 30 tablet, Rfl: 0   Allergies  Allergen Reactions  . Pollen Extract     Past Medical History:  Diagnosis Date  . Chest pain      Past Surgical History:  Procedure Laterality Date  . TONSILLECTOMY    . TONSILLECTOMY AND ADENOIDECTOMY      Family History  Problem Relation Age of Onset  . Diabetes Mother   . Hypertension Mother   . Healthy Father     Social History   Tobacco Use  . Smoking status: Current Some Day Smoker    Packs/day: 1.00  . Smokeless tobacco: Never Used  Substance Use Topics  . Alcohol use: Yes    Comment: occasionally  . Drug use: No    Review of Systems  Constitutional: Positive for malaise/fatigue. Negative for fever.  HENT: Negative for congestion, ear pain, sinus pain and sore throat.        Runny nose.  Eyes: Negative for discharge and redness.  Respiratory: Positive for cough. Negative for hemoptysis, shortness of breath and wheezing.   Cardiovascular: Negative for chest pain.  Gastrointestinal: Negative for abdominal pain, diarrhea, nausea and vomiting.  Genitourinary: Negative for dysuria, flank pain and hematuria.  Musculoskeletal: Negative for myalgias.  Skin: Negative for rash.  Neurological: Negative for dizziness,  weakness and headaches.  Psychiatric/Behavioral: Negative for depression and substance abuse.     Objective:   Vitals: BP 128/72 (BP Location: Right Arm)   Pulse 80   Temp 98 F (36.7 C) (Oral)   Resp 16   SpO2 97%   Physical Exam Constitutional:      General: He is not in acute distress.    Appearance: Normal appearance. He is well-developed and normal weight. He is not ill-appearing, toxic-appearing or diaphoretic.  HENT:     Head: Normocephalic and atraumatic.     Right Ear: Tympanic membrane, ear canal and external ear normal. There is no impacted cerumen.     Left Ear: Tympanic membrane, ear canal and external ear normal. There is no impacted cerumen.     Nose: Nose normal. No congestion or rhinorrhea.     Mouth/Throat:     Mouth: Mucous membranes are moist.     Pharynx: Oropharynx is clear. No oropharyngeal exudate or posterior oropharyngeal erythema.  Eyes:     General: No scleral icterus.       Right eye: No discharge.        Left eye: No discharge.     Extraocular Movements: Extraocular movements intact.     Conjunctiva/sclera: Conjunctivae normal.     Pupils: Pupils are equal, round, and reactive to light.  Cardiovascular:     Rate and Rhythm: Normal rate and regular rhythm.     Heart sounds: Normal heart sounds. No murmur. No friction rub.  No gallop.   Pulmonary:     Effort: Pulmonary effort is normal. No respiratory distress.     Breath sounds: Normal breath sounds. No stridor. No wheezing, rhonchi or rales.  Musculoskeletal:     Cervical back: Normal range of motion and neck supple. No rigidity. No muscular tenderness.  Neurological:     General: No focal deficit present.     Mental Status: He is alert and oriented to person, place, and time.  Psychiatric:        Mood and Affect: Mood normal.        Behavior: Behavior normal.        Thought Content: Thought content normal.     Assessment and Plan :   1. Exposure to COVID-19 virus   2. Cough   3.  Stuffy and runny nose   4. Viral syndrome   5. Tobacco user     Will manage for viral illness such as viral URI, viral rhinitis, possible COVID-19. Counseled patient on nature of COVID-19 including modes of transmission, diagnostic testing, management and supportive care.  Offered symptomatic relief. COVID 19 testing is pending.  Physical exam findings and vital signs stable for discharge.  Counseled patient on potential for adverse effects with medications prescribed/recommended today, ER and return-to-clinic precautions discussed, patient verbalized understanding.     Jaynee Eagles, PA-C 10/13/19 1300

## 2019-10-15 LAB — NOVEL CORONAVIRUS, NAA: SARS-CoV-2, NAA: NOT DETECTED

## 2019-12-20 ENCOUNTER — Other Ambulatory Visit: Payer: Self-pay

## 2019-12-20 ENCOUNTER — Ambulatory Visit (INDEPENDENT_AMBULATORY_CARE_PROVIDER_SITE_OTHER): Payer: No Typology Code available for payment source | Admitting: Nurse Practitioner

## 2019-12-20 ENCOUNTER — Encounter: Payer: Self-pay | Admitting: Nurse Practitioner

## 2019-12-20 VITALS — BP 96/58 | HR 75 | Temp 97.7°F | Resp 18 | Ht 69.0 in | Wt 133.4 lb

## 2019-12-20 DIAGNOSIS — Z13220 Encounter for screening for lipoid disorders: Secondary | ICD-10-CM | POA: Diagnosis not present

## 2019-12-20 DIAGNOSIS — Z1331 Encounter for screening for depression: Secondary | ICD-10-CM

## 2019-12-20 DIAGNOSIS — Z13228 Encounter for screening for other metabolic disorders: Secondary | ICD-10-CM

## 2019-12-20 DIAGNOSIS — Z7689 Persons encountering health services in other specified circumstances: Secondary | ICD-10-CM

## 2019-12-20 DIAGNOSIS — Z130 Encounter for screening for diseases of the blood and blood-forming organs and certain disorders involving the immune mechanism: Secondary | ICD-10-CM

## 2019-12-20 DIAGNOSIS — F17200 Nicotine dependence, unspecified, uncomplicated: Secondary | ICD-10-CM | POA: Insufficient documentation

## 2019-12-20 DIAGNOSIS — Z0000 Encounter for general adult medical examination without abnormal findings: Secondary | ICD-10-CM

## 2019-12-20 HISTORY — DX: Nicotine dependence, unspecified, uncomplicated: F17.200

## 2019-12-20 LAB — LIPID PANEL
Cholesterol: 106 mg/dL (ref <200)
HDL: 34 mg/dL — ABNORMAL LOW (ref >=40)
LDL Cholesterol (Calc): 54 mg/dL (calc)
Non-HDL Cholesterol (Calc): 72 mg/dL (calc) (ref <130)
Total CHOL/HDL Ratio: 3.1 (calc) (ref <5.0)
Triglycerides: 98 mg/dL (ref <150)

## 2019-12-20 LAB — CBC WITH DIFFERENTIAL/PLATELET
Absolute Monocytes: 328 cells/uL (ref 200–950)
Basophils Absolute: 67 cells/uL (ref 0–200)
Basophils Relative: 1 %
Eosinophils Absolute: 221 cells/uL (ref 15–500)
Eosinophils Relative: 3.3 %
HCT: 45.5 % (ref 38.5–50.0)
Hemoglobin: 15.8 g/dL (ref 13.2–17.1)
Lymphs Abs: 2774 cells/uL (ref 850–3900)
MCH: 31.9 pg (ref 27.0–33.0)
MCHC: 34.7 g/dL (ref 32.0–36.0)
MCV: 91.9 fL (ref 80.0–100.0)
MPV: 11.3 fL (ref 7.5–12.5)
Monocytes Relative: 4.9 %
Neutro Abs: 3310 cells/uL (ref 1500–7800)
Neutrophils Relative %: 49.4 %
Platelets: 150 10*3/uL (ref 140–400)
RBC: 4.95 10*6/uL (ref 4.20–5.80)
RDW: 12.2 % (ref 11.0–15.0)
Total Lymphocyte: 41.4 %
WBC: 6.7 10*3/uL (ref 3.8–10.8)

## 2019-12-20 LAB — COMPLETE METABOLIC PANEL WITH GFR
AG Ratio: 1.9 (calc) (ref 1.0–2.5)
ALT: 15 U/L (ref 9–46)
AST: 17 U/L (ref 10–40)
Albumin: 4.2 g/dL (ref 3.6–5.1)
Alkaline phosphatase (APISO): 53 U/L (ref 36–130)
BUN: 12 mg/dL (ref 7–25)
CO2: 27 mmol/L (ref 20–32)
Calcium: 9 mg/dL (ref 8.6–10.3)
Chloride: 106 mmol/L (ref 98–110)
Creat: 0.69 mg/dL (ref 0.60–1.35)
GFR, Est African American: 156 mL/min/{1.73_m2} (ref >=60)
GFR, Est Non African American: 135 mL/min/{1.73_m2} (ref >=60)
Globulin: 2.2 g/dL (calc) (ref 1.9–3.7)
Glucose, Bld: 84 mg/dL (ref 65–99)
Potassium: 4.1 mmol/L (ref 3.5–5.3)
Sodium: 140 mmol/L (ref 135–146)
Total Bilirubin: 0.4 mg/dL (ref 0.2–1.2)
Total Protein: 6.4 g/dL (ref 6.1–8.1)

## 2019-12-20 NOTE — Progress Notes (Signed)
New Patient Office Visit  Subjective:  Patient ID: Russell Huynh, male    DOB: 10/31/1995  Age: 24 y.o. MRN: [id number]  CC:  Chief Complaint  Patient presents with  . Establish Care    NP    HPI Russell Huynh is a 23y.o. male presenting to establish care. Denied need for STD screen. Smokes 1/2 pack a day reports he is considering quitting. Counseled  Health hx and medications discussed and reviewed. Depression and CAGE negative.   No cp/ct, gu/gi sxs, pain, sob, edema, or falls/injuries.   Past Medical History:  Diagnosis Date  . Chest pain     Past Surgical History:  Procedure Laterality Date  . TONSILLECTOMY    . TONSILLECTOMY AND ADENOIDECTOMY      Family History  Problem Relation Age of Onset  . Diabetes Mother   . Hypertension Mother   . Healthy Father     Social History   Socioeconomic History  . Marital status: Single    Spouse name: Not on file  . Number of children: Not on file  . Years of education: Not on file  . Highest education level: Not on file  Occupational History  . Not on file  Tobacco Use  . Smoking status: Current Some Day Smoker    Packs/day: 1.00  . Smokeless tobacco: Never Used  Substance and Sexual Activity  . Alcohol use: Yes    Comment: occasionally  . Drug use: No  . Sexual activity: Not on file  Other Topics Concern  . Not on file  Social History Narrative  . Not on file   Social Determinants of Health   Financial Resource Strain:   . Difficulty of Paying Living Expenses: Not on file  Food Insecurity:   . Worried About Programme researcher, broadcasting/film/video in the Last Year: Not on file  . Ran Out of Food in the Last Year: Not on file  Transportation Needs:   . Lack of Transportation (Medical): Not on file  . Lack of Transportation (Non-Medical): Not on file  Physical Activity:   . Days of Exercise per Week: Not on file  . Minutes of Exercise per Session: Not on file  Stress:   . Feeling of Stress : Not on file  Social  Connections:   . Frequency of Communication with Friends and Family: Not on file  . Frequency of Social Gatherings with Friends and Family: Not on file  . Attends Religious Services: Not on file  . Active Member of Clubs or Organizations: Not on file  . Attends Banker Meetings: Not on file  . Marital Status: Not on file  Intimate Partner Violence:   . Fear of Current or Ex-Partner: Not on file  . Emotionally Abused: Not on file  . Physically Abused: Not on file  . Sexually Abused: Not on file    ROS Review of Systems  All other systems reviewed and are negative.   Objective:   Today's Vitals: BP (!) 96/58 (BP Location: Left Arm, Patient Position: Sitting, Cuff Size: Normal)   Pulse 75   Temp 97.7 F (36.5 C) (Oral)   Resp 18   Ht 5\' 9"  (1.753 m)   Wt 133 lb 6.4 oz (60.5 kg)   SpO2 98%   BMI 19.70 kg/m   Physical Exam Vitals and nursing note reviewed.  Constitutional:      Appearance: Normal appearance. He is well-developed, well-groomed and normal weight.  HENT:     Head: Normocephalic.  Right Ear: Tympanic membrane, ear canal and external ear normal.     Left Ear: Tympanic membrane, ear canal and external ear normal.     Nose: Nose normal. No congestion or rhinorrhea.     Mouth/Throat:     Mouth: Mucous membranes are moist.     Pharynx: Oropharynx is clear.  Eyes:     General: Lids are normal. Lids are everted, no foreign bodies appreciated. No scleral icterus.    Extraocular Movements: Extraocular movements intact.     Left eye: Normal extraocular motion and no nystagmus.     Conjunctiva/sclera: Conjunctivae normal.     Right eye: Right conjunctiva is not injected.     Left eye: Left conjunctiva is not injected.     Pupils: Pupils are equal, round, and reactive to light.  Neck:     Thyroid: No thyromegaly or thyroid tenderness.  Cardiovascular:     Rate and Rhythm: Normal rate and regular rhythm.     Pulses: Normal pulses.     Heart sounds:  Normal heart sounds, S1 normal and S2 normal. No murmur.  Pulmonary:     Effort: Pulmonary effort is normal.     Breath sounds: Normal breath sounds.  Abdominal:     General: Abdomen is flat. Bowel sounds are normal.     Palpations: Abdomen is soft.     Tenderness: There is no abdominal tenderness.  Musculoskeletal:        General: Normal range of motion.     Cervical back: Normal range of motion and neck supple. No edema or erythema.     Right lower leg: No edema.     Left lower leg: No edema.  Lymphadenopathy:     Head:     Right side of head: No submental, submandibular, tonsillar, preauricular, posterior auricular or occipital adenopathy.     Left side of head: No submental, submandibular, tonsillar, preauricular, posterior auricular or occipital adenopathy.     Cervical: No cervical adenopathy.  Skin:    General: Skin is warm and dry.     Capillary Refill: Capillary refill takes less than 2 seconds.  Neurological:     General: No focal deficit present.     Mental Status: He is alert and oriented to person, place, and time.     Cranial Nerves: Cranial nerves are intact.  Psychiatric:        Attention and Perception: Attention normal.        Mood and Affect: Mood and affect normal.        Speech: Speech normal.        Behavior: Behavior normal. Behavior is cooperative.        Cognition and Memory: Cognition normal.        Judgment: Judgment normal.     Assessment & Plan:  Print out for healthy diet and quit smoking provided.   Problem List Items Addressed This Visit    None    Visit Diagnoses    Encounter to establish care    -  Primary   Relevant Orders   Lipid panel   CBC with Differential/Platelet   COMPLETE METABOLIC PANEL WITH GFR   Screening for metabolic disorder       Relevant Orders   COMPLETE METABOLIC PANEL WITH GFR   Screening for deficiency anemia       Relevant Orders   CBC with Differential/Platelet   Screening, lipid       Relevant Orders    Lipid panel  Negative depression screening          Outpatient Encounter Medications as of 12/20/2019  Medication Sig  . Melatonin 1 MG TABS Take by mouth.  . [DISCONTINUED] benzonatate (TESSALON) 100 MG capsule Take 1-2 capsules (100-200 mg total) by mouth 3 (three) times daily as needed.  . [DISCONTINUED] promethazine-dextromethorphan (PROMETHAZINE-DM) 6.25-15 MG/5ML syrup Take 5 mLs by mouth at bedtime as needed for cough.   No facility-administered encounter medications on file as of 12/20/2019.    Follow-up: Return in about 1 year (around 12/19/2020).   Annie Main, FNP

## 2019-12-21 NOTE — Progress Notes (Signed)
Normal labs.

## 2020-04-06 ENCOUNTER — Telehealth (INDEPENDENT_AMBULATORY_CARE_PROVIDER_SITE_OTHER): Payer: No Typology Code available for payment source | Admitting: Nurse Practitioner

## 2020-04-06 ENCOUNTER — Other Ambulatory Visit: Payer: Self-pay | Admitting: Nurse Practitioner

## 2020-04-06 ENCOUNTER — Other Ambulatory Visit: Payer: Self-pay

## 2020-04-06 DIAGNOSIS — R197 Diarrhea, unspecified: Secondary | ICD-10-CM | POA: Diagnosis not present

## 2020-04-06 DIAGNOSIS — R1111 Vomiting without nausea: Secondary | ICD-10-CM | POA: Diagnosis not present

## 2020-04-06 DIAGNOSIS — B349 Viral infection, unspecified: Secondary | ICD-10-CM | POA: Diagnosis not present

## 2020-04-06 MED ORDER — ONDANSETRON 4 MG PO TBDP: 4.0000 mg | ORAL_TABLET | Freq: Three times a day (TID) | ORAL | 0 refills | Status: DC | PRN

## 2020-04-06 NOTE — Progress Notes (Signed)
Virtual Visit via Telephone Note  I connected with Russell Huynh on 04/06/20 at 11:15 AM EDT by telephone and verified that I am speaking with the correct person using two identifiers.   I discussed the limitations, risks, security and privacy concerns of performing an evaluation and management service by telephone and the availability of in person appointments. I also discussed with the patient that there may be a patient responsible charge related to this service. The patient expressed understanding and agreed to proceed.   History of Present Illness: Pt is a 24 year old male presenting for telephone visit for sxs of of stomach upset since last night, woke 530 am with nausea, vomited 6 times, diarrhea twice. Last night ate cookout restaurant meal he ate: meal consisting of double burger tray with chicken ranch wrap and quesadilia. No abnormal taste or smell to food. No sick contacts, No h/a, loss of taste, or smell. Fever 101 this AM now 99 without medications to reduce fever. Taken Tums that did not relieve GI sxs. No other txs tried. No other sxs such as nasal congestion, drainage, general body aches.    Observations/Objective:  Past Medical History:  Diagnosis Date  . Chest pain   . Smoker 12/20/2019   Past Surgical History:  Procedure Laterality Date  . TONSILLECTOMY    . TONSILLECTOMY AND ADENOIDECTOMY     Current Outpatient Medications on File Prior to Visit  Medication Sig Dispense Refill  . Melatonin 1 MG TABS Take by mouth.     No current facility-administered medications on file prior to visit.   Allergies  Allergen Reactions  . Pollen Extract    Social History   Socioeconomic History  . Marital status: Single    Spouse name: Not on file  . Number of children: Not on file  . Years of education: Not on file  . Highest education level: Not on file  Occupational History  . Not on file  Tobacco Use  . Smoking status: Current Some Day Smoker    Packs/day: 1.00  .  Smokeless tobacco: Never Used  Substance and Sexual Activity  . Alcohol use: Yes    Comment: occasionally  . Drug use: No  . Sexual activity: Not on file  Other Topics Concern  . Not on file  Social History Narrative  . Not on file   Social Determinants of Health   Financial Resource Strain:   . Difficulty of Paying Living Expenses:   Food Insecurity:   . Worried About Charity fundraiser in the Last Year:   . Arboriculturist in the Last Year:   Transportation Needs:   . Film/video editor (Medical):   Marland Kitchen Lack of Transportation (Non-Medical):   Physical Activity:   . Days of Exercise per Week:   . Minutes of Exercise per Session:   Stress:   . Feeling of Stress :   Social Connections:   . Frequency of Communication with Friends and Family:   . Frequency of Social Gatherings with Friends and Family:   . Attends Religious Services:   . Active Member of Clubs or Organizations:   . Attends Archivist Meetings:   Marland Kitchen Marital Status:   Intimate Partner Violence:   . Fear of Current or Ex-Partner:   . Emotionally Abused:   Marland Kitchen Physically Abused:   . Sexually Abused:    Breast Cancer-relatedfamily history is not on file.  There is no immunization history on file for this patient.  Assessment and Plan: Diarrhea, unspecified type  Vomiting without nausea, intractability of vomiting not specified, unspecified vomiting type - Plan: ondansetron (ZOFRAN ODT) 4 MG disintegrating tablet  Viral illness   Your sxs are most likely viral GI illness and are self limiting:  Drink plenty of fluids.  Clear liquid diet today: rest gut: 30 ml of fluids every 30 minutes. Advance to full liquid as tolerated tomorrow, then to bland diet next day as tolerated such as toast, rice, applesauce, banana.   May take antidiarheal OTC, fever reducing medication such as tylenol/ibuprofen OTC as needed   RX for nausea medication   Seek medical attention for right away: Sxs of dehydration  to seek medical attention: dry mouth/lips/oral mucousa, decreased, darker color urine, muscle cramps, feeling increased tiredness/fatiqued, difficulty caring for yourself.   Follow Up Instructions:    I discussed the assessment and treatment plan with the patient. The patient was provided an opportunity to ask questions and all were answered. The patient agreed with the plan and demonstrated an understanding of the instructions.   The patient was advised to call back or seek an in-person evaluation if the symptoms worsen or if the condition fails to improve as anticipated.  I provided 15 minutes of non-face-to-face time during this encounter. Time ended 11:30AM  Elmore Guise, FNP

## 2020-05-24 ENCOUNTER — Ambulatory Visit
Admission: RE | Admit: 2020-05-24 | Discharge: 2020-05-24 | Disposition: A | Discharge status: Home / Self Care | Payer: No Typology Code available for payment source | Source: Ambulatory Visit | Attending: Emergency Medicine | Admitting: Emergency Medicine

## 2020-05-24 ENCOUNTER — Other Ambulatory Visit: Payer: Self-pay

## 2020-05-24 VITALS — BP 133/78 | HR 84 | Temp 98.9°F | Resp 18

## 2020-05-24 DIAGNOSIS — Z1152 Encounter for screening for COVID-19: Secondary | ICD-10-CM | POA: Diagnosis not present

## 2020-05-24 DIAGNOSIS — R112 Nausea with vomiting, unspecified: Secondary | ICD-10-CM

## 2020-05-24 DIAGNOSIS — J069 Acute upper respiratory infection, unspecified: Secondary | ICD-10-CM

## 2020-05-24 MED ORDER — ONDANSETRON HCL 4 MG PO TABS: 4.0000 mg | ORAL_TABLET | Freq: Three times a day (TID) | ORAL | 0 refills | Status: DC | PRN

## 2020-05-24 MED ORDER — FLUTICASONE PROPIONATE 50 MCG/ACT NA SUSP: 1.0000 | Freq: Every day | NASAL | 0 refills | Status: DC

## 2020-05-24 MED ORDER — CETIRIZINE HCL 10 MG PO TABS: 10.0000 mg | ORAL_TABLET | Freq: Every day | ORAL | 0 refills | Status: DC

## 2020-05-24 NOTE — ED Provider Notes (Signed)
[facility]   [id number] 05/24/20 Arrival Time: 1834   CC: COVID symptoms  SUBJECTIVE: History from: patient.  Adler Chartrand is a 24 y.o. male who presents to the urgent care for complaint of nasal congestion, stuffy nose, postnasal drip and headache that started last night and nausea and vomiting that started today.  Denies sick exposure to COVID, flu or strep.  Denies recent travel.  Denies aggravating or alleviating factors.  Denies previous symptoms in the past.   Denies fever, chills, fatigue, sinus pain, rhinorrhea, sore throat, SOB, wheezing, chest pain, nausea, changes in bowel or bladder habits.     ROS: As per HPI.  All other pertinent ROS negative.      Past Medical History:  Diagnosis Date  . Chest pain   . Smoker 12/20/2019   Past Surgical History:  Procedure Laterality Date  . TONSILLECTOMY    . TONSILLECTOMY AND ADENOIDECTOMY     Allergies  Allergen Reactions  . Pollen Extract    No current facility-administered medications on file prior to encounter.   Current Outpatient Medications on File Prior to Encounter  Medication Sig Dispense Refill  . Melatonin 1 MG TABS Take by mouth.     Social History   Socioeconomic History  . Marital status: Single    Spouse name: Not on file  . Number of children: Not on file  . Years of education: Not on file  . Highest education level: Not on file  Occupational History  . Not on file  Tobacco Use  . Smoking status: Current Some Day Smoker    Packs/day: 1.00  . Smokeless tobacco: Never Used  Substance and Sexual Activity  . Alcohol use: Yes    Comment: occasionally  . Drug use: No  . Sexual activity: Not on file  Other Topics Concern  . Not on file  Social History Narrative  . Not on file   Social Determinants of Health   Financial Resource Strain:   . Difficulty of Paying Living Expenses:   Food Insecurity:   . Worried About Programme researcher, broadcasting/film/video in the Last Year:   . Barista in the Last  Year:   Transportation Needs:   . Freight forwarder (Medical):   Marland Kitchen Lack of Transportation (Non-Medical):   Physical Activity:   . Days of Exercise per Week:   . Minutes of Exercise per Session:   Stress:   . Feeling of Stress :   Social Connections:   . Frequency of Communication with Friends and Family:   . Frequency of Social Gatherings with Friends and Family:   . Attends Religious Services:   . Active Member of Clubs or Organizations:   . Attends Banker Meetings:   Marland Kitchen Marital Status:   Intimate Partner Violence:   . Fear of Current or Ex-Partner:   . Emotionally Abused:   Marland Kitchen Physically Abused:   . Sexually Abused:    Family History  Problem Relation Age of Onset  . Diabetes Mother   . Hypertension Mother   . Healthy Father     OBJECTIVE:  Vitals:   05/24/20 1907 05/24/20 1908  BP: 133/78   Pulse: 84   Resp: (!) 84 18  Temp: 98.9 F (37.2 C)   SpO2: 97%      General appearance: alert; appears fatigued, but nontoxic; speaking in full sentences and tolerating own secretions HEENT: NCAT; Ears: EACs clear, TMs pearly gray; Eyes: PERRL.  EOM grossly intact. Sinuses:  nontender; Nose: nares patent without rhinorrhea, Throat: oropharynx clear, tonsils non erythematous or enlarged, uvula midline  Neck: supple without LAD Lungs: unlabored respirations, symmetrical air entry; cough: absent; no respiratory distress; CTAB Heart: regular rate and rhythm.  Radial pulses 2+ symmetrical bilaterally Skin: warm and dry Psychological: alert and cooperative; normal mood and affect  LABS:  No results found for this or any previous visit (from the past 24 hour(s)).   ASSESSMENT & PLAN:  1. Viral URI with cough   2. Encounter for screening for COVID-19   3. Non-intractable vomiting with nausea, unspecified vomiting type     Meds ordered this encounter  Medications  . fluticasone (FLONASE) 50 MCG/ACT nasal spray    Sig: Place 1 spray into both nostrils daily  for 14 days.    Dispense:  16 g    Refill:  0  . cetirizine (ZYRTEC ALLERGY) 10 MG tablet    Sig: Take 1 tablet (10 mg total) by mouth daily.    Dispense:  30 tablet    Refill:  0  . ondansetron (ZOFRAN) 4 MG tablet    Sig: Take 1 tablet (4 mg total) by mouth every 8 (eight) hours as needed for nausea or vomiting.    Dispense:  20 tablet    Refill:  0    Discharge instructions  COVID testing ordered.  It will take between 2-7 days for test results.  Someone will contact you regarding abnormal results.    In the meantime: You should remain isolated in your home for 10 days from symptom onset AND greater than 24 hours after symptoms resolution (absence of fever without the use of fever-reducing medication and improvement in respiratory symptoms), whichever is longer Get plenty of rest and push fluids Prescribe Zofran for nausea/vomiting Prescribed  zyrtec for nasal congestion, runny nose, and/or sore throat Prescribed flonase for left middle ear effusion Use medications daily for symptom relief Use OTC medications like ibuprofen or tylenol as needed fever or pain Call or go to the ED if you have any new or worsening symptoms such as fever, worsening cough, shortness of breath, chest tightness, chest pain, turning blue, changes in mental status, etc...   Reviewed expectations re: course of current medical issues. Questions answered. Outlined signs and symptoms indicating need for more acute intervention. Patient verbalized understanding. After Visit Summary given.      Note: This document was prepared using Dragon voice recognition software and may include unintentional dictation errors.    Durward Parcel, FNP 05/24/20 1926

## 2020-05-24 NOTE — Discharge Instructions (Signed)
COVID testing ordered.  It will take between 2-7 days for test results.  Someone will contact you regarding abnormal results.    In the meantime: You should remain isolated in your home for 10 days from symptom onset AND greater than 24 hours after symptoms resolution (absence of fever without the use of fever-reducing medication and improvement in respiratory symptoms), whichever is longer Get plenty of rest and push fluids Prescribe Zofran for nausea/vomiting Prescribed  zyrtec for nasal congestion, runny nose, and/or sore throat Prescribed flonase for left middle ear effusion Use medications daily for symptom relief Use OTC medications like ibuprofen or tylenol as needed fever or pain Call or go to the ED if you have any new or worsening symptoms such as fever, worsening cough, shortness of breath, chest tightness, chest pain, turning blue, changes in mental status, etc.

## 2020-05-24 NOTE — ED Triage Notes (Signed)
Pt began having nasal congestion and headache last night, then vomited twice earlier today with some LLQ pain

## 2020-05-25 LAB — SARS-COV-2, NAA 2 DAY TAT

## 2020-05-25 LAB — NOVEL CORONAVIRUS, NAA: SARS-CoV-2, NAA: NOT DETECTED

## 2020-05-27 ENCOUNTER — Encounter: Payer: Self-pay | Admitting: Emergency Medicine

## 2020-05-27 ENCOUNTER — Other Ambulatory Visit: Payer: Self-pay

## 2020-05-27 ENCOUNTER — Ambulatory Visit
Admission: EM | Admit: 2020-05-27 | Discharge: 2020-05-27 | Disposition: A | Discharge status: Home / Self Care | Payer: No Typology Code available for payment source | Attending: Emergency Medicine | Admitting: Emergency Medicine

## 2020-05-27 DIAGNOSIS — J019 Acute sinusitis, unspecified: Secondary | ICD-10-CM | POA: Diagnosis not present

## 2020-05-27 MED ORDER — AMOXICILLIN-POT CLAVULANATE 875-125 MG PO TABS: 1.0000 | ORAL_TABLET | Freq: Two times a day (BID) | ORAL | 0 refills | Status: AC

## 2020-05-27 NOTE — Discharge Instructions (Signed)
Get plenty of rest and push fluids Augmentin for sinus infection.  Take as directed and to completion Use OTC medications like ibuprofen or tylenol as needed fever or pain Call or go to the ED if you have any new or worsening symptoms such as fever, worsening cough, shortness of breath, chest tightness, chest pain, turning blue, changes in mental status, etc..Marland Kitchen

## 2020-05-27 NOTE — ED Triage Notes (Signed)
Pt had nasal congestion and headache that has gotten better but now c/o dry cough.

## 2020-05-27 NOTE — ED Provider Notes (Signed)
[facility]   [id number] 05/27/20 Arrival Time: 1058   CC: URI  SUBJECTIVE: History from: patient.  Russell Huynh is a 24 y.o. male who presents with nasal congestion, sinus pain/ pressure, sinus headache, and dry cough x 4-5 days.  Brother tested positive for COVID.  Has tried OTC medications without relief.  Denies aggravating factors.  Denies previous symptoms in the past.   Also mentions loss of taste, nausea, vomiting, and diarrhea.  Denies SOB, wheezing, chest pain, changes in bladder habits.    COVID test negative on 05/24/20.    ROS: As per HPI.  All other pertinent ROS negative.     Past Medical History:  Diagnosis Date  . Chest pain   . Smoker 12/20/2019   Past Surgical History:  Procedure Laterality Date  . TONSILLECTOMY    . TONSILLECTOMY AND ADENOIDECTOMY     Allergies  Allergen Reactions  . Pollen Extract    No current facility-administered medications on file prior to encounter.   Current Outpatient Medications on File Prior to Encounter  Medication Sig Dispense Refill  . cetirizine (ZYRTEC ALLERGY) 10 MG tablet Take 1 tablet (10 mg total) by mouth daily. 30 tablet 0  . fluticasone (FLONASE) 50 MCG/ACT nasal spray Place 1 spray into both nostrils daily for 14 days. 16 g 0  . Melatonin 1 MG TABS Take by mouth.    . ondansetron (ZOFRAN) 4 MG tablet Take 1 tablet (4 mg total) by mouth every 8 (eight) hours as needed for nausea or vomiting. 20 tablet 0   Social History   Socioeconomic History  . Marital status: Single    Spouse name: Not on file  . Number of children: Not on file  . Years of education: Not on file  . Highest education level: Not on file  Occupational History  . Not on file  Tobacco Use  . Smoking status: Current Some Day Smoker    Packs/day: 1.00  . Smokeless tobacco: Never Used  Substance and Sexual Activity  . Alcohol use: Yes    Comment: occasionally  . Drug use: No  . Sexual activity: Not on file  Other Topics Concern  .  Not on file  Social History Narrative  . Not on file   Social Determinants of Health   Financial Resource Strain:   . Difficulty of Paying Living Expenses:   Food Insecurity:   . Worried About Programme researcher, broadcasting/film/video in the Last Year:   . Barista in the Last Year:   Transportation Needs:   . Freight forwarder (Medical):   Marland Kitchen Lack of Transportation (Non-Medical):   Physical Activity:   . Days of Exercise per Week:   . Minutes of Exercise per Session:   Stress:   . Feeling of Stress :   Social Connections:   . Frequency of Communication with Friends and Family:   . Frequency of Social Gatherings with Friends and Family:   . Attends Religious Services:   . Active Member of Clubs or Organizations:   . Attends Banker Meetings:   Marland Kitchen Marital Status:   Intimate Partner Violence:   . Fear of Current or Ex-Partner:   . Emotionally Abused:   Marland Kitchen Physically Abused:   . Sexually Abused:    Family History  Problem Relation Age of Onset  . Diabetes Mother   . Hypertension Mother   . Healthy Father     OBJECTIVE:  Vitals:   05/27/20 1108  BP: 131/82  Pulse: 77  Resp: 17  Temp: 97.9 F (36.6 C)  TempSrc: Oral  SpO2: 98%     General appearance: alert; appears fatigued, but nontoxic; speaking in full sentences and tolerating own secretions HEENT: NCAT; Ears: EACs clear, TMs pearly gray; Eyes: PERRL.  EOM grossly intact. Sinuses: TTP over frontal sinuses; Nose: nares patent without rhinorrhea, Throat: oropharynx clear, tonsils non erythematous or enlarged, uvula midline  Neck: supple without LAD Lungs: unlabored respirations, symmetrical air entry; cough: mild; no respiratory distress; CTAB Heart: regular rate and rhythm.   Skin: warm and dry Psychological: alert and cooperative; normal mood and affect   ASSESSMENT & PLAN:  1. Acute non-recurrent sinusitis, unspecified location     Meds ordered this encounter  Medications  . amoxicillin-clavulanate  (AUGMENTIN) 875-125 MG tablet    Sig: Take 1 tablet by mouth every 12 (twelve) hours for 10 days.    Dispense:  20 tablet    Refill:  0    Order Specific Question:   Supervising Provider    Answer:   Eustace Moore [[id number]]    Get plenty of rest and push fluids Augmentin for sinus infection.  Take as directed and to completion Use OTC medications like ibuprofen or tylenol as needed fever or pain Call or go to the ED if you have any new or worsening symptoms such as fever, worsening cough, shortness of breath, chest tightness, chest pain, turning blue, changes in mental status, etc...   Reviewed expectations re: course of current medical issues. Questions answered. Outlined signs and symptoms indicating need for more acute intervention. Patient verbalized understanding. After Visit Summary given.         Rennis Harding, PA-C 05/27/20 1150

## 2020-07-07 ENCOUNTER — Ambulatory Visit: Payer: No Typology Code available for payment source | Admitting: Family Medicine

## 2020-07-10 ENCOUNTER — Other Ambulatory Visit: Payer: Self-pay

## 2020-07-10 ENCOUNTER — Ambulatory Visit (INDEPENDENT_AMBULATORY_CARE_PROVIDER_SITE_OTHER): Payer: No Typology Code available for payment source | Admitting: Family Medicine

## 2020-07-10 ENCOUNTER — Encounter: Payer: Self-pay | Admitting: Family Medicine

## 2020-07-10 VITALS — BP 110/68 | HR 82 | Temp 98.0°F | Resp 16 | Ht 69.0 in | Wt 130.0 lb

## 2020-07-10 DIAGNOSIS — R9431 Abnormal electrocardiogram [ECG] [EKG]: Secondary | ICD-10-CM

## 2020-07-10 DIAGNOSIS — I4510 Unspecified right bundle-branch block: Secondary | ICD-10-CM

## 2020-07-10 DIAGNOSIS — M778 Other enthesopathies, not elsewhere classified: Secondary | ICD-10-CM

## 2020-07-10 DIAGNOSIS — M25511 Pain in right shoulder: Secondary | ICD-10-CM

## 2020-07-10 DIAGNOSIS — R55 Syncope and collapse: Secondary | ICD-10-CM | POA: Diagnosis not present

## 2020-07-10 MED ORDER — HYDROCODONE-ACETAMINOPHEN 5-325 MG PO TABS
1.0000 | ORAL_TABLET | Freq: Four times a day (QID) | ORAL | 0 refills | Status: DC | PRN
Start: 2020-07-10 — End: 2020-08-25

## 2020-07-10 MED ORDER — METHYLPREDNISOLONE 4 MG PO TBPK: ORAL_TABLET | ORAL | 0 refills | Status: DC

## 2020-07-10 NOTE — Patient Instructions (Signed)
Referral to orthopedics Start steroids and pain medication Referral for echo for your heart We will call with results

## 2020-07-10 NOTE — Progress Notes (Signed)
Subjective:    Patient ID: Russell Huynh, male    DOB: Mar 06, 1996, 24 y.o.   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 workers and felt faint, felt a warmness and a co workder noticed he was getting pale, He started to sit down and he passed out in the floor He was checked out by two ER nurses but not checked into the ER Blood pressure was okay, given apple juice and crackers and then he felt fine He was covered in sweat after the episode No seizure activity He does pass out with blood draws or when his sugar drops  He had eaten lunch that day and ws drinking throughout his shift  Pt also states he has history ofsyncope as a teen, he wore a heart monitor for a while but nothing was found   Right shoulder pain- Friday, he had pressured washed his car, he thought he just pulled something but since then pain has progressed Now when he moves has sharp pain down his his forearm and wrist Taking ibuprofen No previous injury No tingling no numbness He is still trying to his job but increasing difficulty as 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        Review Of Systems:  GEN- denies fatigue, fever, weight loss,weakness, recent illness HEENT- denies eye drainage, change in vision, nasal discharge, CVS- denies chest pain, palpitations RESP- denies SOB, cough, wheeze ABD- denies N/V, change in stools, abd pain GU- denies dysuria, hematuria, dribbling, incontinence MSK- denies joint pain, muscle aches, injury Neuro- denies headache, dizziness, syncope, seizure activity       Objective:    BP 110/68   Pulse 82   Temp 98 F (36.7 C) (Temporal)   Resp 16   Ht 5\' 9"  (1.753 m)   Wt 130 lb (59 kg)   SpO2 98%   BMI 19.20 kg/m  GEN- NAD, alert and oriented x3 HEENT- PERRL, EOMI, non  injected sclera, pink conjunctiva, MMM, oropharynx clear Neck- Supple, no thyromegaly, no bruit  CVS- RRR, no murmur RESP-CTAB ABD-NABS,soft,NT,ND NEURO-CNII -XII in tact, no focal deficits  MSK- decreased ROM RUE compared to left, pain with elevation of arm above shoulder height. Biceps in tact +empty can,,TTP along forearm, no swelling of UE,  Decreased strength RUE compared to left  EXT- No edema Pulses- Radial, DP- 2+  EKG- NSR, incomplete RBBB       Assessment & Plan:      Problem List Items Addressed This Visit    None    Visit Diagnoses    Syncope and collapse    -  Primary   unclear cause but EKG shows incomplete RBB, he has some work up years ago, obtain Echo, may need repeat cardiilogy evaluation, labs drawn, bp normal   Relevant Orders   EKG 12-Lead (Completed)   CBC with Differential/Platelet   Comprehensive metabolic panel   TSH   Acute pain of right shoulder       Concern for tendinitis vs other injury after pressure washing however pt has lost some strength and ROM, start Medrol dose pak, norco, urgent referral to ortho   Relevant Orders   Ambulatory referral to Orthopedic Surgery   Tendinitis of right forearm  Relevant Orders   Ambulatory referral to Orthopedic Surgery   Abnormal EKG       Incomplete RBBB       with syncope and shoulder injury, out of work remainder of week   Relevant Orders   CBC with Differential/Platelet   Comprehensive metabolic panel   TSH      Note: This dictation was prepared with Dragon dictation along with smaller phrase technology. Any transcriptional errors that result from this process are unintentional.

## 2020-07-11 ENCOUNTER — Encounter: Payer: Self-pay | Admitting: Family Medicine

## 2020-07-11 LAB — COMPREHENSIVE METABOLIC PANEL
AG Ratio: 1.9 (calc) (ref 1.0–2.5)
ALT: 13 U/L (ref 9–46)
AST: 15 U/L (ref 10–40)
Albumin: 4.4 g/dL (ref 3.6–5.1)
Alkaline phosphatase (APISO): 52 U/L (ref 36–130)
BUN: 11 mg/dL (ref 7–25)
CO2: 23 mmol/L (ref 20–32)
Calcium: 9.3 mg/dL (ref 8.6–10.3)
Chloride: 107 mmol/L (ref 98–110)
Creat: 0.78 mg/dL (ref 0.60–1.35)
Globulin: 2.3 g/dL (calc) (ref 1.9–3.7)
Glucose, Bld: 90 mg/dL (ref 65–99)
Potassium: 4.2 mmol/L (ref 3.5–5.3)
Sodium: 141 mmol/L (ref 135–146)
Total Bilirubin: 0.5 mg/dL (ref 0.2–1.2)
Total Protein: 6.7 g/dL (ref 6.1–8.1)

## 2020-07-11 LAB — CBC WITH DIFFERENTIAL/PLATELET
Absolute Monocytes: 428 cells/uL (ref 200–950)
Basophils Absolute: 50 cells/uL (ref 0–200)
Basophils Relative: 0.8 %
Eosinophils Absolute: 143 cells/uL (ref 15–500)
Eosinophils Relative: 2.3 %
HCT: 46.1 % (ref 38.5–50.0)
Hemoglobin: 15.9 g/dL (ref 13.2–17.1)
Lymphs Abs: 2108 cells/uL (ref 850–3900)
MCH: 31.8 pg (ref 27.0–33.0)
MCHC: 34.5 g/dL (ref 32.0–36.0)
MCV: 92.2 fL (ref 80.0–100.0)
MPV: 11.5 fL (ref 7.5–12.5)
Monocytes Relative: 6.9 %
Neutro Abs: 3472 cells/uL (ref 1500–7800)
Neutrophils Relative %: 56 %
Platelets: 137 10*3/uL — ABNORMAL LOW (ref 140–400)
RBC: 5 10*6/uL (ref 4.20–5.80)
RDW: 12.1 % (ref 11.0–15.0)
Total Lymphocyte: 34 %
WBC: 6.2 10*3/uL (ref 3.8–10.8)

## 2020-07-11 LAB — TSH: TSH: 0.53 mIU/L (ref 0.40–4.50)

## 2020-07-11 NOTE — Addendum Note (Signed)
Addended by: Milinda Antis F on: 07/11/2020 11:13 AM   Modules accepted: Orders

## 2020-07-13 ENCOUNTER — Encounter: Payer: Self-pay | Admitting: Family Medicine

## 2020-07-20 ENCOUNTER — Other Ambulatory Visit: Payer: Self-pay

## 2020-07-20 ENCOUNTER — Ambulatory Visit (HOSPITAL_COMMUNITY)
Admission: RE | Admit: 2020-07-20 | Discharge: 2020-07-20 | Disposition: A | Discharge status: Home / Self Care | Payer: No Typology Code available for payment source | Source: Ambulatory Visit | Attending: Family Medicine | Admitting: Family Medicine

## 2020-07-20 DIAGNOSIS — I4510 Unspecified right bundle-branch block: Secondary | ICD-10-CM | POA: Insufficient documentation

## 2020-07-20 DIAGNOSIS — R55 Syncope and collapse: Secondary | ICD-10-CM | POA: Insufficient documentation

## 2020-07-20 DIAGNOSIS — R9431 Abnormal electrocardiogram [ECG] [EKG]: Secondary | ICD-10-CM | POA: Insufficient documentation

## 2020-07-20 LAB — ECHOCARDIOGRAM COMPLETE
Area-P 1/2: 2.74 cm2
S' Lateral: 2.38 cm

## 2020-07-20 NOTE — Progress Notes (Signed)
  Echocardiogram 2D Echocardiogram has been performed.  Russell Huynh 07/20/2020, 2:37 PM

## 2020-07-24 ENCOUNTER — Other Ambulatory Visit: Payer: Self-pay | Admitting: *Deleted

## 2020-07-24 DIAGNOSIS — R55 Syncope and collapse: Secondary | ICD-10-CM

## 2020-07-25 ENCOUNTER — Encounter: Payer: Self-pay | Admitting: Family Medicine

## 2020-08-17 ENCOUNTER — Ambulatory Visit (INDEPENDENT_AMBULATORY_CARE_PROVIDER_SITE_OTHER): Payer: No Typology Code available for payment source | Admitting: Cardiology

## 2020-08-17 ENCOUNTER — Encounter: Payer: Self-pay | Admitting: Cardiology

## 2020-08-17 ENCOUNTER — Other Ambulatory Visit: Payer: Self-pay

## 2020-08-17 VITALS — BP 100/58 | HR 70 | Ht 69.0 in | Wt 130.0 lb

## 2020-08-17 DIAGNOSIS — Z7189 Other specified counseling: Secondary | ICD-10-CM | POA: Diagnosis not present

## 2020-08-17 DIAGNOSIS — R55 Syncope and collapse: Secondary | ICD-10-CM

## 2020-08-17 NOTE — Progress Notes (Signed)
Cardiology Office Note:    Date:  08/17/2020   ID:  Kaushik Maul, DOB 07/19/1996, MRN [id number]  PCP:  Salley Scarlet, MD  Cardiologist:  Jodelle Red, MD  Referring MD: Salley Scarlet, MD   Chief Complaint  Patient presents with  . New Patient (Initial Visit)    History of Present Illness:    Russell Huynh is a 24 y.o. male with a hx of tobacco use who is seen as a new consult at the request of Jeanice Lim, Velna Hatchet, MD for the evaluation and management of syncope.  Note from Dr. Jeanice Lim dated 07/10/20 reviewed. Noted recent syncope with prodrome. Prior history of syncope as a teenager, reportedly wore a monitor but nothing was found.  Echo from 07/20/20 reviewed. Noted as normal. Referred to cardiology to discuss potential monitor.  Today: No other concerns beyond syncope today.  Initial episode: first event in high school, was told it was due to using the bathroom and standing up too quickly. No further issues until he recently passed out at work.   He was standing up at work talking to a coworker, get very hot and lightheaded. Sat down, felt poorly. Got up to walk to break room, passed out. Happened again two weeks later at home, felt off. Had just walked the dog, didn't feel right, sat on couch, got lightheaded/dizzy, felt warm, and then passed out.  Frequency: two recently, three total Duration of episodes: very brief Presyncopal symptoms: hot, sweaty, dizzy/lightheaded Post syncope symptoms: briefly very confused, but then normal. Was very sweaty, felt weak/tingly but felt better after the events. No vomiting, no losing control of bowel/bladder Aggravating/alleviating factors: sitting down helps but does not completely avoid the event. Pre-existing medical conditions: none Potential medication/supplement interactions: norco and melatonin at bedtime Prior workup: echo, reviewed. Reported wearing a 30 day monitor in the past, did not show anything.  ECG: NSR,  iRBBB Family history: Dennie Bible Gpa died at a young age from an MI (believes he was in his 90s). Mat great-gpa had MI. No other heart issues, no sudden cardiac death.   Past Medical History:  Diagnosis Date  . Chest pain   . Smoker 12/20/2019    Past Surgical History:  Procedure Laterality Date  . TONSILLECTOMY    . TONSILLECTOMY AND ADENOIDECTOMY      Current Medications: Current Outpatient Medications on File Prior to Visit  Medication Sig  . HYDROcodone-acetaminophen (NORCO) 5-325 MG tablet Take 1 tablet by mouth every 6 (six) hours as needed for moderate pain. (Patient taking differently: Take 1 tablet by mouth at bedtime as needed for moderate pain. )  . Melatonin 1 MG TABS Take 1 mg by mouth at bedtime.    No current facility-administered medications on file prior to visit.     Allergies:   Pollen extract   Social History   Tobacco Use  . Smoking status: Current Some Day Smoker    Packs/day: 1.00  . Smokeless tobacco: Never Used  Substance Use Topics  . Alcohol use: Yes    Comment: occasionally  . Drug use: No    Family History: family history includes Drug abuse in his maternal grandmother; Healthy in his father; Hypertension in his mother.  ROS:   Please see the history of present illness.  Additional pertinent ROS: Constitutional: Negative for chills, fever, night sweats, unintentional weight loss  HENT: Negative for ear pain and hearing loss.   Eyes: Negative for loss of vision and eye pain.  Respiratory: Negative  for cough, sputum, wheezing.   Cardiovascular: See HPI. Gastrointestinal: Negative for abdominal pain, melena, and hematochezia.  Genitourinary: Negative for dysuria and hematuria.  Musculoskeletal: Negative for falls and myalgias.  Skin: Negative for itching and rash.  Neurological: Negative for focal weakness, focal sensory changes and loss of consciousness.  Endo/Heme/Allergies: Does not bruise/bleed easily.     EKGs/Labs/Other Studies Reviewed:     The following studies were reviewed today: Echo 07/20/20 1. Left ventricular ejection fraction, by estimation, is 60 to 65%. The  left ventricle has normal function. The left ventricle has no regional  wall motion abnormalities. Left ventricular diastolic parameters were  normal.  2. Right ventricular systolic function is normal. The right ventricular  size is normal. There is normal pulmonary artery systolic pressure. The  estimated right ventricular systolic pressure is 27.0 mmHg.  3. The mitral valve is grossly normal. No evidence of mitral valve  regurgitation.  4. The aortic valve is tricuspid. Aortic valve regurgitation is not  visualized.  5. The inferior vena cava is normal in size with <50% respiratory  variability, suggesting right atrial pressure of 8 mmHg.   EKG:  EKG is personally reviewed.  The ekg ordered today demonstrates NSR at 70 bpm with incomplete RBBB (normal for age)  Recent Labs: 07/10/2020: ALT 13; BUN 11; Creat 0.78; Hemoglobin 15.9; Platelets 137; Potassium 4.2; Sodium 141; TSH 0.53  Recent Lipid Panel    Component Value Date/Time   CHOL 106 12/20/2019 1029   TRIG 98 12/20/2019 1029   HDL 34 (L) 12/20/2019 1029   CHOLHDL 3.1 12/20/2019 1029   LDLCALC 54 12/20/2019 1029    Physical Exam:    VS:  BP (!) 100/58 (BP Location: Left Arm, Patient Position: Sitting, Cuff Size: Normal)   Pulse 70   Ht 5\' 9"  (1.753 m)   Wt 130 lb (59 kg)   BMI 19.20 kg/m    Orthostatics: Lying 106/62, HR 71 Sitting 112/66, HR 84 Standing 102/66, HR 97 Standing 3 mins 108/70, HR 97  Wt Readings from Last 3 Encounters:  08/17/20 130 lb (59 kg)  07/10/20 130 lb (59 kg)  12/20/19 133 lb 6.4 oz (60.5 kg)    GEN: Well nourished, well developed in no acute distress HEENT: Normal, moist mucous membranes NECK: No JVD CARDIAC: regular rhythm, normal S1 and S2, no rubs or gallops. No murmurs. VASCULAR: Radial and DP pulses 2+ bilaterally. No carotid  bruits RESPIRATORY:  Clear to auscultation without rales, wheezing or rhonchi  ABDOMEN: Soft, non-tender, non-distended MUSCULOSKELETAL:  Ambulates independently SKIN: Warm and dry, no edema NEUROLOGIC:  Alert and oriented x 3. No focal neuro deficits noted. PSYCHIATRIC:  Normal affect    ASSESSMENT:    1. Vasovagal syncope   2. Cardiac risk counseling   3. Counseling on health promotion and disease prevention    PLAN:    Syncope: -we discussed the multiple possible etiologies of syncope, including both cardiac and noncardiac causes -we discussed high risk features to watch for; none noted on interview today -we discussed guideline recommended evaluation. ECG today unremarkable, normal echocardiogram rules out structural heart disease -if no clear cardiac or neurologic etiology found, we discussed reflex syncope and management strategies. Given instructions on this today. -given infrequency of events, monitor likely to be low yield. We discussed both Zio and Kardiamobile today. He will contact me if syncope recurs.  Cardiac risk counseling and prevention recommendations: -recommend heart healthy/Mediterranean diet, with whole grains, fruits, vegetable, fish, lean meats, nuts, and  olive oil. Limit salt. -recommend moderate walking, 3-5 times/week for 30-50 minutes each session. Aim for at least 150 minutes.week. Goal should be pace of 3 miles/hours, or walking 1.5 miles in 30 minutes -recommend avoidance of tobacco products. Avoid excess alcohol. -ASCVD risk score: The ASCVD Risk score Denman George DC Jr., et al., 2013) failed to calculate for the following reasons:   The 2013 ASCVD risk score is only valid for ages 79 to 75    Plan for follow up: as needed  Jodelle Red, MD, PhD [place]  [facility]    Medication Adjustments/Labs and Tests Ordered: Current medicines are reviewed at length with the patient today.  Concerns regarding medicines are outlined above.  Orders  Placed This Encounter  Procedures  . EKG 12-Lead   No orders of the defined types were placed in this encounter.   Patient Instructions  Medication Instructions:  Your Physician recommend you continue on your current medication as directed.    *If you need a refill on your cardiac medications before your next appointment, please call your pharmacy*   Lab Work: None ordered   Testing/Procedures: None ordered    Follow-Up: At [facility], you and your health needs are our priority.  As part of our continuing mission to provide you with exceptional heart care, we have created designated Provider Care Teams.  These Care Teams include your primary Cardiologist (physician) and Advanced Practice Providers (APPs -  Physician Assistants and Nurse Practitioners) who all work together to provide you with the care you need, when you need it.  We recommend signing up for the patient portal called "MyChart".  Sign up information is provided on this After Visit Summary.  MyChart is used to connect with patients for Virtual Visits (Telemedicine).  Patients are able to view lab/test results, encounter notes, upcoming appointments, etc.  Non-urgent messages can be sent to your provider as well.   To learn more about what you can do with MyChart, go to [email or web address].    Your next appointment:   As needed  The format for your next appointment:   In Person  Provider:   Jodelle Red, MD   Other Instructions -avoid dehydration. Often it requires high volumes of fluids, often with salt/electrolytes included, to stay hydrated. Some people are very sensitive to fluid shifts and dehydration. Oral rehydration is preferred, and routine use of IV fluids is not recommended. -if tolerated, compression stocking can assist with fluid management and prevent pooling in the legs. -slow position changes are recommended -if there is a feeling of severe lightheadedness, like near to passing  out, recommend lying on the floor on the back, with legs elevated up on a chair or up against the wall. -the best long term management is gradual exercise conditioning. I recommend seated exercises such as bike to start, to avoid the risk of falling with lightheadedness. Exercise programs, either through supervised programs like cardiac rehab or through personal programs, should focus on gradually increasing exercise tolerance and conditioning.    Signed, Jodelle Red, MD PhD 08/17/2020    [facility]

## 2020-08-17 NOTE — Patient Instructions (Addendum)
Medication Instructions:  Your Physician recommend you continue on your current medication as directed.    *If you need a refill on your cardiac medications before your next appointment, please call your pharmacy*   Lab Work: None ordered   Testing/Procedures: None ordered    Follow-Up: At [facility], you and your health needs are our priority.  As part of our continuing mission to provide you with exceptional heart care, we have created designated Provider Care Teams.  These Care Teams include your primary Cardiologist (physician) and Advanced Practice Providers (APPs -  Physician Assistants and Nurse Practitioners) who all work together to provide you with the care you need, when you need it.  We recommend signing up for the patient portal called "MyChart".  Sign up information is provided on this After Visit Summary.  MyChart is used to connect with patients for Virtual Visits (Telemedicine).  Patients are able to view lab/test results, encounter notes, upcoming appointments, etc.  Non-urgent messages can be sent to your provider as well.   To learn more about what you can do with MyChart, go to [email or web address].    Your next appointment:   As needed  The format for your next appointment:   In Person  Provider:   Jodelle Red, MD   Other Instructions -avoid dehydration. Often it requires high volumes of fluids, often with salt/electrolytes included, to stay hydrated. Some people are very sensitive to fluid shifts and dehydration. Oral rehydration is preferred, and routine use of IV fluids is not recommended. -if tolerated, compression stocking can assist with fluid management and prevent pooling in the legs. -slow position changes are recommended -if there is a feeling of severe lightheadedness, like near to passing out, recommend lying on the floor on the back, with legs elevated up on a chair or up against the wall. -the best long term management is  gradual exercise conditioning. I recommend seated exercises such as bike to start, to avoid the risk of falling with lightheadedness. Exercise programs, either through supervised programs like cardiac rehab or through personal programs, should focus on gradually increasing exercise tolerance and conditioning.

## 2020-08-23 ENCOUNTER — Ambulatory Visit: Payer: No Typology Code available for payment source | Admitting: Family Medicine

## 2020-08-25 ENCOUNTER — Encounter: Payer: Self-pay | Admitting: Family Medicine

## 2020-08-25 ENCOUNTER — Other Ambulatory Visit: Payer: Self-pay

## 2020-08-25 ENCOUNTER — Ambulatory Visit (INDEPENDENT_AMBULATORY_CARE_PROVIDER_SITE_OTHER): Payer: No Typology Code available for payment source | Admitting: Family Medicine

## 2020-08-25 VITALS — BP 102/64 | HR 62 | Temp 98.2°F | Resp 14 | Ht 69.0 in | Wt 137.0 lb

## 2020-08-25 DIAGNOSIS — M25511 Pain in right shoulder: Secondary | ICD-10-CM

## 2020-08-25 DIAGNOSIS — Z23 Encounter for immunization: Secondary | ICD-10-CM

## 2020-08-25 DIAGNOSIS — G8929 Other chronic pain: Secondary | ICD-10-CM

## 2020-08-25 DIAGNOSIS — M7551 Bursitis of right shoulder: Secondary | ICD-10-CM

## 2020-08-25 MED ORDER — NAPROXEN 500 MG PO TABS: 500.0000 mg | ORAL_TABLET | Freq: Two times a day (BID) | ORAL | 1 refills | Status: DC

## 2020-08-25 MED ORDER — HYDROCODONE-ACETAMINOPHEN 5-325 MG PO TABS
1.0000 | ORAL_TABLET | Freq: Four times a day (QID) | ORAL | 0 refills | Status: DC | PRN
Start: 2020-08-25 — End: 2020-10-16

## 2020-08-25 NOTE — Patient Instructions (Signed)
F/U as needed  Pain medication refilled Naprosyn take twice a day for inflammation with food

## 2020-08-25 NOTE — Progress Notes (Signed)
   Subjective:    Patient ID: Russell Huynh, male    DOB: 08-Apr-1996, 24 y.o.   MRN: [id number]  Patient presents for R Shoulder Pain (9/19- has been seen at [facility])  Patient here with ongoing right shoulder pain.  I referred him to orthopedics back in September due to right shoulder pain that occurred after he was pressure washing his car.  He had one orthopedic note which was initially stated that he had an ultrasound done that did not show any tear or fluid.  He also had x-ray which is unremarkable.  He was diagnosed with bursitis and given steroid injection which she states made it worse.  He was also prescribed meloxicam.  I also given him hydrocodone.  He states he went back for follow-up because of his not improving he had an MRI done of his neck but was told there was nothing there because the pain might he states he never did MRI of his shoulder he still having pain and unable to work.  He did however states that he has been out on disability orthopedic fill out short-term disability but he is not sure when he was first returned to work.  He does not feel like he has been working he continues to have pain.  Unfortunately I do not have any notations after his first visit  Also seen by me diagnosed with vasovagal syncope  Review Of Systems:  GEN- denies fatigue, fever, weight loss,weakness, recent illness HEENT- denies eye drainage, change in vision, nasal discharge, CVS- denies chest pain, palpitations RESP- denies SOB, cough, wheeze ABD- denies N/V, change in stools, abd pain GU- denies dysuria, hematuria, dribbling, incontinence MSK- + joint pain, muscle aches, injury Neuro- denies headache, dizziness, syncope, seizure activity       Objective:    BP 102/64   Pulse 62   Temp 98.2 F (36.8 C) (Temporal)   Resp 14   Ht 5\' 9"  (1.753 m)   Wt 137 lb (62.1 kg)   SpO2 97%   BMI 20.23 kg/m  GEN- NAD, alert and oriented x3 HEENT- PERRL, EOMI, non injected sclera, pink  conjunctiva, MMM, oropharynx clear Neck- Supple, good ROM  CVS- RRR, no murmur RESP-CTAB MSK- fair ROM RUE compared to left, TTP post shoulder, bicep in tact EXT- No edema Pulses- Radial, DP- 2+           Assessment & Plan:      Problem List Items Addressed This Visit    None    Visit Diagnoses    Chronic right shoulder pain    -  Primary   will obtain ortho notes, he is already out of work on disability advised the physician that completed forms would need to either return him to work or extend, I refilled norco, he was taking at bedtime Also given naprosyn, since he completed mobic  He may need second opinion will review records first    Relevant Medications   HYDROcodone-acetaminophen (NORCO) 5-325 MG tablet   naproxen (NAPROSYN) 500 MG tablet   Bursitis of right shoulder       Need for immunization against influenza       Relevant Orders   Flu Vaccine QUAD 36+ mos IM (Completed)      Note: This dictation was prepared with Dragon dictation along with smaller phrase technology. Any transcriptional errors that result from this process are unintentional.

## 2020-09-04 ENCOUNTER — Encounter: Payer: Self-pay | Admitting: Family Medicine

## 2020-10-16 ENCOUNTER — Other Ambulatory Visit: Payer: Self-pay

## 2020-10-16 ENCOUNTER — Ambulatory Visit (INDEPENDENT_AMBULATORY_CARE_PROVIDER_SITE_OTHER): Payer: No Typology Code available for payment source | Admitting: Family Medicine

## 2020-10-16 ENCOUNTER — Encounter: Payer: Self-pay | Admitting: Family Medicine

## 2020-10-16 VITALS — BP 110/65 | HR 78 | Temp 97.4°F | Wt 136.0 lb

## 2020-10-16 DIAGNOSIS — F5104 Psychophysiologic insomnia: Secondary | ICD-10-CM | POA: Insufficient documentation

## 2020-10-16 DIAGNOSIS — F418 Other specified anxiety disorders: Secondary | ICD-10-CM | POA: Diagnosis not present

## 2020-10-16 MED ORDER — TRAZODONE HCL 50 MG PO TABS: 25.0000 mg | ORAL_TABLET | Freq: Every evening | ORAL | 3 refills | Status: DC | PRN

## 2020-10-16 NOTE — Assessment & Plan Note (Signed)
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  There is a lot on his mind.  He also with past family history of depression anxiety.  We will start him on trazodone discussed the side effects of this medication. Prefer stay away from anything that may be habit-forming at this time.  We will follow-up in 4 to 6 weeks to see how his medications are doing.

## 2020-10-16 NOTE — Patient Instructions (Addendum)
Take trazodone start with 1/2 tablet,then increase to 1 full tablet F/U 4-6 weeks virtual visit okay

## 2020-10-16 NOTE — Progress Notes (Signed)
   Subjective:    Patient ID: Russell Huynh, male    DOB: December 22, 1995, 24 y.o.   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  He is thinking about the career change his family recently moved his son also recently had an accident and broke his arm.  He has tried melatonin  OTC that hasnt helped   He is currently in PT   Mom had MDD and sleep issues- mom takes lexapro for depression  Father has depression - he doesn't take meds      Review Of Systems:  GEN- denies fatigue, fever, weight loss,weakness, recent illness HEENT- denies eye drainage, change in vision, nasal discharge, CVS- denies chest pain, palpitations RESP- denies SOB, cough, wheeze ABD- denies N/V, change in stools, abd pain GU- denies dysuria, hematuria, dribbling, incontinence MSK- denies joint pain, muscle aches, injury Neuro- denies headache, dizziness, syncope, seizure activity       Objective:    BP 110/65   Pulse 78   Temp (!) 97.4 F (36.3 C) (Skin)   Wt 136 lb (61.7 kg)   SpO2 98%   BMI 20.08 kg/m  GEN- NAD, alert and oriented x3 CVS- RRR, no murmur RESP-CTAB Psych normal affect and mood Pulses- Radial, DP- 2+        Assessment & Plan:      Problem List Items Addressed This Visit      Unprioritized   Chronic insomnia - Primary    Insomnia in the setting of situational anxiety 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  There is a lot on his mind.  He also with past family history of depression anxiety.  We will start him on trazodone  discussed the side effects of this medication. Prefer stay away from anything that may be habit-forming at this time.  We will follow-up in 4 to 6 weeks to see how his medications are doing.       Other Visit Diagnoses    Situational anxiety       Relevant Medications   traZODone (DESYREL) 50 MG tablet      Note: This dictation was prepared with Dragon dictation along with smaller phrase technology. Any transcriptional errors that result from this process are unintentional.

## 2020-10-25 ENCOUNTER — Encounter: Payer: Self-pay | Admitting: Family Medicine

## 2020-12-06 DIAGNOSIS — M67911 Unspecified disorder of synovium and tendon, right shoulder: Secondary | ICD-10-CM | POA: Diagnosis not present

## 2020-12-13 ENCOUNTER — Telehealth: Payer: Self-pay | Admitting: *Deleted

## 2020-12-13 NOTE — Telephone Encounter (Signed)
Received form from [facility] in regards to patient disability.   PCP reviewed form and states that patient would need to have orthopedics complete form as they took him out of work for extended period of time.   States that [employer] can cover 07/10/2020- 07/14/2020.  MyChart message sent to patient.

## 2020-12-19 NOTE — Telephone Encounter (Signed)
Patient son and Significant other in office and forms discussed. Routed back to provider for completion.

## 2021-01-11 DIAGNOSIS — N5089 Other specified disorders of the male genital organs: Secondary | ICD-10-CM | POA: Diagnosis not present

## 2021-01-17 DIAGNOSIS — N50812 Left testicular pain: Secondary | ICD-10-CM | POA: Diagnosis not present

## 2021-01-17 DIAGNOSIS — N5089 Other specified disorders of the male genital organs: Secondary | ICD-10-CM | POA: Diagnosis not present

## 2021-01-30 DIAGNOSIS — Z6820 Body mass index (BMI) 20.0-20.9, adult: Secondary | ICD-10-CM | POA: Diagnosis not present

## 2021-01-30 DIAGNOSIS — N503 Cyst of epididymis: Secondary | ICD-10-CM | POA: Diagnosis not present

## 2021-01-30 DIAGNOSIS — R9389 Abnormal findings on diagnostic imaging of other specified body structures: Secondary | ICD-10-CM | POA: Diagnosis not present

## 2021-02-26 DIAGNOSIS — Z681 Body mass index (BMI) 19 or less, adult: Secondary | ICD-10-CM | POA: Diagnosis not present

## 2021-02-26 DIAGNOSIS — R319 Hematuria, unspecified: Secondary | ICD-10-CM | POA: Diagnosis not present

## 2021-02-26 DIAGNOSIS — Z832 Family history of diseases of the blood and blood-forming organs and certain disorders involving the immune mechanism: Secondary | ICD-10-CM | POA: Diagnosis not present

## 2021-02-26 DIAGNOSIS — Z8043 Family history of malignant neoplasm of testis: Secondary | ICD-10-CM | POA: Diagnosis not present

## 2021-02-26 DIAGNOSIS — G4700 Insomnia, unspecified: Secondary | ICD-10-CM | POA: Diagnosis not present

## 2021-02-26 DIAGNOSIS — N4340 Spermatocele of epididymis, unspecified: Secondary | ICD-10-CM | POA: Diagnosis not present

## 2021-03-12 DIAGNOSIS — R112 Nausea with vomiting, unspecified: Secondary | ICD-10-CM | POA: Diagnosis not present

## 2021-03-14 DIAGNOSIS — H5213 Myopia, bilateral: Secondary | ICD-10-CM | POA: Diagnosis not present

## 2021-04-11 DIAGNOSIS — H52223 Regular astigmatism, bilateral: Secondary | ICD-10-CM | POA: Diagnosis not present

## 2021-04-26 DIAGNOSIS — R0789 Other chest pain: Secondary | ICD-10-CM | POA: Diagnosis not present

## 2021-05-09 DIAGNOSIS — M25511 Pain in right shoulder: Secondary | ICD-10-CM | POA: Diagnosis not present

## 2021-10-09 DIAGNOSIS — M545 Low back pain, unspecified: Secondary | ICD-10-CM | POA: Diagnosis not present

## 2021-10-09 DIAGNOSIS — M546 Pain in thoracic spine: Secondary | ICD-10-CM | POA: Diagnosis not present

## 2021-10-09 DIAGNOSIS — S299XXA Unspecified injury of thorax, initial encounter: Secondary | ICD-10-CM | POA: Diagnosis not present

## 2021-11-26 DIAGNOSIS — R1084 Generalized abdominal pain: Secondary | ICD-10-CM | POA: Diagnosis not present

## 2021-11-26 DIAGNOSIS — R109 Unspecified abdominal pain: Secondary | ICD-10-CM | POA: Diagnosis not present

## 2021-11-28 DIAGNOSIS — R109 Unspecified abdominal pain: Secondary | ICD-10-CM | POA: Diagnosis not present

## 2021-11-28 DIAGNOSIS — R110 Nausea: Secondary | ICD-10-CM | POA: Diagnosis not present

## 2021-11-28 DIAGNOSIS — R935 Abnormal findings on diagnostic imaging of other abdominal regions, including retroperitoneum: Secondary | ICD-10-CM | POA: Diagnosis not present

## 2021-11-28 DIAGNOSIS — R911 Solitary pulmonary nodule: Secondary | ICD-10-CM | POA: Diagnosis not present

## 2021-11-28 DIAGNOSIS — N200 Calculus of kidney: Secondary | ICD-10-CM | POA: Diagnosis not present

## 2021-12-03 DIAGNOSIS — N200 Calculus of kidney: Secondary | ICD-10-CM | POA: Diagnosis not present

## 2021-12-03 DIAGNOSIS — R519 Headache, unspecified: Secondary | ICD-10-CM | POA: Diagnosis not present

## 2021-12-03 DIAGNOSIS — R911 Solitary pulmonary nodule: Secondary | ICD-10-CM | POA: Diagnosis not present

## 2021-12-04 DIAGNOSIS — N200 Calculus of kidney: Secondary | ICD-10-CM | POA: Diagnosis not present

## 2021-12-04 DIAGNOSIS — R911 Solitary pulmonary nodule: Secondary | ICD-10-CM | POA: Diagnosis not present

## 2021-12-04 DIAGNOSIS — R519 Headache, unspecified: Secondary | ICD-10-CM | POA: Diagnosis not present

## 2021-12-07 ENCOUNTER — Other Ambulatory Visit: Payer: Self-pay

## 2021-12-07 ENCOUNTER — Encounter: Payer: Self-pay | Admitting: Urology

## 2021-12-07 ENCOUNTER — Ambulatory Visit (INDEPENDENT_AMBULATORY_CARE_PROVIDER_SITE_OTHER): Payer: BC Managed Care – PPO | Admitting: Urology

## 2021-12-07 VITALS — BP 105/66 | HR 70 | Wt 135.0 lb

## 2021-12-07 DIAGNOSIS — N200 Calculus of kidney: Secondary | ICD-10-CM | POA: Diagnosis not present

## 2021-12-07 MED ORDER — IBUPROFEN 600 MG PO TABS
600.0000 mg | ORAL_TABLET | Freq: Three times a day (TID) | ORAL | 0 refills | Status: DC | PRN
Start: 2021-12-07 — End: 2023-04-16

## 2021-12-07 MED ORDER — OXYCODONE-ACETAMINOPHEN 5-325 MG PO TABS: 1.0000 | ORAL_TABLET | ORAL | 0 refills | Status: DC | PRN

## 2021-12-07 MED ORDER — TAMSULOSIN HCL 0.4 MG PO CAPS: 0.4000 mg | ORAL_CAPSULE | Freq: Every day | ORAL | 0 refills | Status: DC

## 2021-12-07 NOTE — Progress Notes (Signed)
12/07/2021 9:32 AM   Russell Huynh Russell Huynh [birth year] [id number]  Referring provider: No referring provider defined for this encounter.  nephrolithiasis   HPI: Mr Russell Huynh is a 26yo here for evaluation of nephrolithiasis. 2 weeks ago he developed sharp, intermittent, mild to moderate, nonraditing right flank pain. He presented to his PCP and underwent CT last week which showed right renal pelvis fullness and 2 1mm right renal calculi. He prior hx of nephrolithiasis. He prescribed flomax, hydrocodone. He has nausea but no vomiting. He has new nocturia and urinary urgency since the pain started.    PMH: Past Medical History:  Diagnosis Date   Chest pain    Smoker 12/20/2019    Surgical History: Past Surgical History:  Procedure Laterality Date   TONSILLECTOMY     TONSILLECTOMY AND ADENOIDECTOMY      Home Medications:  Allergies as of 12/07/2021       Reactions   Pollen Extract         Medication List        Accurate as of December 07, 2021  9:32 AM. If you have any questions, ask your nurse or doctor.          buPROPion 150 MG 24 hr tablet Commonly known as: WELLBUTRIN XL Take 150 mg by mouth daily.   cyclobenzaprine 5 MG tablet Commonly known as: FLEXERIL Take 5 mg by mouth 3 (three) times daily as needed.   diclofenac 50 MG EC tablet Commonly known as: VOLTAREN Take 50 mg by mouth 2 (two) times daily.   HYDROcodone-acetaminophen 5-325 MG tablet Commonly known as: NORCO/VICODIN Take 1 tablet by mouth every 6 (six) hours as needed.   melatonin 1 MG Tabs tablet Take 1 mg by mouth at bedtime.   traZODone 50 MG tablet Commonly known as: DESYREL Take 0.5-1 tablets (25-50 mg total) by mouth at bedtime as needed for sleep.        Allergies:  Allergies  Allergen Reactions   Pollen Extract     Family History: Family History  Problem Relation Age of Onset   Hypertension Mother    Healthy Father    Drug abuse Maternal Grandmother     Social History:   reports that he has been smoking. He has been smoking an average of 1 pack per day. He has never used smokeless tobacco. He reports current alcohol use. He reports that he does not use drugs.  ROS: All other review of systems were reviewed and are negative except what is noted above in HPI  Physical Exam: BP 105/66    Pulse 70    Wt 135 lb (61.2 kg)    BMI 19.94 kg/m   Constitutional:  Alert and oriented, No acute distress. HEENT: [place] AT, moist mucus membranes.  Trachea midline, no masses. Cardiovascular: No clubbing, cyanosis, or edema. Respiratory: Normal respiratory effort, no increased work of breathing. GI: Abdomen is soft, nontender, nondistended, no abdominal masses GU: No CVA tenderness.  Lymph: No cervical or inguinal lymphadenopathy. Skin: No rashes, bruises or suspicious lesions. Neurologic: Grossly intact, no focal deficits, moving all 4 extremities. Psychiatric: Normal mood and affect.  Laboratory Data: Lab Results  Component Value Date   WBC 6.2 07/10/2020   HGB 15.9 07/10/2020   HCT 46.1 07/10/2020   MCV 92.2 07/10/2020   PLT 137 (L) 07/10/2020    Lab Results  Component Value Date   CREATININE 0.78 07/10/2020    No results found for: PSA  No results found for: TESTOSTERONE  No  results found for: HGBA1C  Urinalysis No results found for: COLORURINE, APPEARANCEUR, LABSPEC, PHURINE, GLUCOSEU, HGBUR, BILIRUBINUR, KETONESUR, PROTEINUR, UROBILINOGEN, NITRITE, LEUKOCYTESUR  No results found for: LABMICR, [place], RBCUA, LABEPIT, MUCUS, BACTERIA  Pertinent Imaging:  No results found for this or any previous visit.  No results found for this or any previous visit.  No results found for this or any previous visit.  No results found for this or any previous visit.  No results found for this or any previous visit.  No results found for this or any previous visit.  No results found for this or any previous visit.  No results found for this or any previous  visit.   Assessment & Plan:    1. Kidney stones -We discussed the management of kidney stones. These options include observation, ureteroscopy, shockwave lithotripsy (ESWL) and percutaneous nephrolithotomy (PCNL). We discussed which options are relevant to the patient's stone(s). We discussed the natural history of kidney stones as well as the complications of untreated stones and the impact on quality of life without treatment as well as with each of the above listed treatments. We also discussed the efficacy of each treatment in its ability to clear the stone burden. With any of these management options I discussed the signs and symptoms of infection and the need for emergent treatment should these be experienced. For each option we discussed the ability of each procedure to clear the patient of their stone burden.   For observation I described the risks which include but are not limited to silent renal damage, life-threatening infection, need for emergent surgery, failure to pass stone and pain.   For ureteroscopy I described the risks which include bleeding, infection, damage to contiguous structures, positioning injury, ureteral stricture, ureteral avulsion, ureteral injury, need for prolonged ureteral stent, inability to perform ureteroscopy, need for an interval procedure, inability to clear stone burden, stent discomfort/pain, heart attack, stroke, pulmonary embolus and the inherent risks with general anesthesia.   For shockwave lithotripsy I described the risks which include arrhythmia, kidney contusion, kidney hemorrhage, need for transfusion, pain, inability to adequately break up stone, inability to pass stone fragments, Steinstrasse, infection associated with obstructing stones, need for alternate surgical procedure, need for repeat shockwave lithotripsy, MI, CVA, PE and the inherent risks with anesthesia/conscious sedation.   For PCNL I described the risks including positioning injury,  pneumothorax, hydrothorax, need for chest tube, inability to clear stone burden, renal laceration, arterial venous fistula or malformation, need for embolization of kidney, loss of kidney or renal function, need for repeat procedure, need for prolonged nephrostomy tube, ureteral avulsion, MI, CVA, PE and the inherent risks of general anesthesia.   - The patient would like to proceed with medical expulsive - Urinalysis, Routine w reflex microscopic   No follow-ups on file.  [name], MD  [facility] [place]

## 2021-12-07 NOTE — Patient Instructions (Signed)
Dietary Guidelines to Help Prevent Kidney Stones Kidney stones are deposits of minerals and salts that form inside your kidneys. Your risk of developing kidney stones may be greater depending on your diet, your lifestyle, the medicines you take, and whether you have certain medical conditions. Most people can lower their chances of developing kidney stones by following the instructions below. Your dietitian may give you more specific instructions depending on your overall health and the type of kidney stones you tend to develop. What are tips for following this plan? Reading food labels  Choose foods with "no salt added" or "low-salt" labels. Limit your salt (sodium) intake to less than 1,500 mg a day. Choose foods with calcium for each meal and snack. Try to eat about 300 mg of calcium at each meal. Foods that contain 200-500 mg of calcium a serving include: 8 oz (237 mL) of milk, calcium-fortifiednon-dairy milk, and calcium-fortifiedfruit juice. Calcium-fortified means that calcium has been added to these drinks. 8 oz (237 mL) of kefir, yogurt, and soy yogurt. 4 oz (114 g) of tofu. 1 oz (28 g) of cheese. 1 cup (150 g) of dried figs. 1 cup (91 g) of cooked broccoli. One 3 oz (85 g) can of sardines or mackerel. Most people need 1,000-1,500 mg of calcium a day. Talk to your dietitian about how much calcium is recommended for you. Shopping Buy plenty of fresh fruits and vegetables. Most people do not need to avoid fruits and vegetables, even if these foods contain nutrients that may contribute to kidney stones. When shopping for convenience foods, choose: Whole pieces of fruit. Pre-made salads with dressing on the side. Low-fat fruit and yogurt smoothies. Avoid buying frozen meals or prepared deli foods. These can be high in sodium. Look for foods with live cultures, such as yogurt and kefir. Choose high-fiber grains, such as whole-wheat breads, oat bran, and wheat cereals. Cooking Do not add  salt to food when cooking. Place a salt shaker on the table and allow each person to add his or her own salt to taste. Use vegetable protein, such as beans, textured vegetable protein (TVP), or tofu, instead of meat in pasta, casseroles, and soups. Meal planning Eat less salt, if told by your dietitian. To do this: Avoid eating processed or pre-made food. Avoid eating fast food. Eat less animal protein, including cheese, meat, poultry, or fish, if told by your dietitian. To do this: Limit the number of times you have meat, poultry, fish, or cheese each week. Eat a diet free of meat at least 2 days a week. Eat only one serving each day of meat, poultry, fish, or seafood. When you prepare animal protein, cut pieces into small portion sizes. For most meat and fish, one serving is about the size of the palm of your hand. Eat at least five servings of fresh fruits and vegetables each day. To do this: Keep fruits and vegetables on hand for snacks. Eat one piece of fruit or a handful of berries with breakfast. Have a salad and fruit at lunch. Have two kinds of vegetables at dinner. Limit foods that are high in a substance called oxalate. These include: Spinach (cooked), rhubarb, beets, sweet potatoes, and Swiss chard. Peanuts. Potato chips, french fries, and baked potatoes with skin on. Nuts and nut products. Chocolate. If you regularly take a diuretic medicine, make sure to eat at least 1 or 2 servings of fruits or vegetables that are high in potassium each day. These include: Avocado. Banana. Orange, prune,   carrot, or tomato juice. Baked potato. Cabbage. Beans and split peas. Lifestyle  Drink enough fluid to keep your urine pale yellow. This is the most important thing you can do. Spread your fluid intake throughout the day. If you drink alcohol: Limit how much you use to: 0-1 drink a day for women who are not pregnant. 0-2 drinks a day for men. Be aware of how much alcohol is in your  drink. In the [place] one drink equals one 12 oz bottle of beer (355 mL), one 5 oz glass of wine (148 mL), or one 1 oz glass of hard liquor (44 mL). Lose weight if told by your health care provider. Work with your dietitian to find an eating plan and weight loss strategies that work best for you. General information Talk to your health care provider and dietitian about taking daily supplements. You may be told the following depending on your health and the cause of your kidney stones: Not to take supplements with vitamin C. To take a calcium supplement. To take a daily probiotic supplement. To take other supplements such as magnesium, fish oil, or vitamin B6. Take over-the-counter and prescription medicines only as told by your health care provider. These include supplements. What foods should I limit? Limit your intake of the following foods, or eat them as told by your dietitian. Vegetables Spinach. Rhubarb. Beets. Canned vegetables. Pickles. Olives. Baked potatoes with skin. Grains Wheat bran. Baked goods. Salted crackers. Cereals high in sugar. Meats and other proteins Nuts. Nut butters. Large portions of meat, poultry, or fish. Salted, precooked, or cured meats, such as sausages, meat loaves, and hot dogs. Dairy Cheese. Beverages Regular soft drinks. Regular vegetable juice. Seasonings and condiments Seasoning blends with salt. Salad dressings. Soy sauce. Ketchup. Barbecue sauce. Other foods Canned soups. Canned pasta sauce. Casseroles. Pizza. Lasagna. Frozen meals. Potato chips. French fries. The items listed above may not be a complete list of foods and beverages you should limit. Contact a dietitian for more information. What foods should I avoid? Talk to your dietitian about specific foods you should avoid based on the type of kidney stones you have and your overall health. Fruits Grapefruit. The item listed above may not be a complete list of foods and beverages you should  avoid. Contact a dietitian for more information. Summary Kidney stones are deposits of minerals and salts that form inside your kidneys. You can lower your risk of kidney stones by making changes to your diet. The most important thing you can do is drink enough fluid. Drink enough fluid to keep your urine pale yellow. Talk to your dietitian about how much calcium you should have each day, and eat less salt and animal protein as told by your dietitian. This information is not intended to replace advice given to you by your health care provider. Make sure you discuss any questions you have with your health care provider. Document Revised: 09/30/2019 Document Reviewed: 09/30/2019 Elsevier Patient Education  2022 [employer]  

## 2021-12-10 ENCOUNTER — Encounter: Payer: Self-pay | Admitting: Urology

## 2021-12-12 LAB — URINALYSIS, ROUTINE W REFLEX MICROSCOPIC
Bilirubin, UA: NEGATIVE
Glucose, UA: NEGATIVE
Ketones, UA: NEGATIVE
Leukocytes,UA: NEGATIVE
Nitrite, UA: NEGATIVE
Protein,UA: NEGATIVE
Specific Gravity, UA: 1.015 (ref 1.005–1.030)
Urobilinogen, Ur: 0.2 mg/dL (ref 0.2–1.0)
pH, UA: 7 (ref 5.0–7.5)

## 2021-12-12 LAB — MICROSCOPIC EXAMINATION
Bacteria, UA: NONE SEEN
Epithelial Cells (non renal): NONE SEEN /hpf (ref 0–10)
Renal Epithel, UA: NONE SEEN /hpf
WBC, UA: NONE SEEN /hpf (ref 0–5)

## 2021-12-17 ENCOUNTER — Encounter: Payer: Self-pay | Admitting: Urology

## 2021-12-17 ENCOUNTER — Other Ambulatory Visit: Payer: Self-pay

## 2021-12-17 ENCOUNTER — Ambulatory Visit (HOSPITAL_COMMUNITY)
Admission: RE | Admit: 2021-12-17 | Discharge: 2021-12-17 | Disposition: A | Discharge status: Home / Self Care | Payer: BC Managed Care – PPO | Source: Ambulatory Visit | Attending: Urology | Admitting: Urology

## 2021-12-17 ENCOUNTER — Ambulatory Visit (INDEPENDENT_AMBULATORY_CARE_PROVIDER_SITE_OTHER): Payer: BC Managed Care – PPO | Admitting: Urology

## 2021-12-17 ENCOUNTER — Other Ambulatory Visit: Payer: Self-pay | Admitting: Urology

## 2021-12-17 VITALS — BP 93/54 | HR 75

## 2021-12-17 DIAGNOSIS — Z87442 Personal history of urinary calculi: Secondary | ICD-10-CM | POA: Diagnosis not present

## 2021-12-17 DIAGNOSIS — N200 Calculus of kidney: Secondary | ICD-10-CM | POA: Insufficient documentation

## 2021-12-17 LAB — URINALYSIS, ROUTINE W REFLEX MICROSCOPIC
Bilirubin, UA: NEGATIVE
Glucose, UA: NEGATIVE
Ketones, UA: NEGATIVE
Leukocytes,UA: NEGATIVE
Nitrite, UA: NEGATIVE
Protein,UA: NEGATIVE
Specific Gravity, UA: 1.02 (ref 1.005–1.030)
Urobilinogen, Ur: 0.2 mg/dL (ref 0.2–1.0)
pH, UA: 7 (ref 5.0–7.5)

## 2021-12-17 LAB — MICROSCOPIC EXAMINATION
Bacteria, UA: NONE SEEN
Renal Epithel, UA: NONE SEEN /hpf
WBC, UA: NONE SEEN /hpf (ref 0–5)

## 2021-12-17 MED ORDER — TAMSULOSIN HCL 0.4 MG PO CAPS: 0.4000 mg | ORAL_CAPSULE | Freq: Every day | ORAL | 0 refills | Status: DC

## 2021-12-17 NOTE — Progress Notes (Signed)
Surgical Physician Order Form [facility] [place]  * Scheduling expectation : Next Available  *Length of Case: 30 minutes  *MD Preforming Case: Wilkie Aye, MD  *Assistant Needed: no  *Facility Preference: [facility]  *Clearance needed: no  *Anticoagulation Instructions: Hold all anticoagulants  *Aspirin Instructions: Ok to continue Aspirin  -Admit type: OUTpatient  -Anesthesia: General  -Use Standing Orders: NA  *Diagnosis: Right renal calculi  *Procedure: right Ureteroscopy w/laser lithotripsy & stent placement (29476)  Additional orders: N/A  -Equipment: C-Arm, digital uscope, holmium laser -VTE Prophylaxis Standing Order SCDs       Other:   -Standing Lab Orders Per Anesthesia    Lab other: None  -Standing Test orders EKG/Chest x-ray per Anesthesia       Test other:   - Medications:  Ancef 2gm IV  -Other orders:  Ok to proceed with Ancef PCN allergy reviewed  *Post-op visit Date/Instructions:  1 week cysto stent removal

## 2021-12-17 NOTE — H&P (View-Only) (Signed)
° °12/17/2021 °2:26 PM  ° °Russell Huynh °08/10/1996 °[id number] ° °Referring provider: No referring provider defined for this encounter. ° °Followup nephrolithiasis and nocturia ° ° °HPI: °Mr Russell Huynh is a 25yo here for followup for nephrolithiasis and nocturia. Nocturia improved to 1x since last visit. He has not passed any calculi since last visit. He continues to have intermittent right flank pain that is sharp, intermittent, mild to moderate, and radiates to his groin. Pain medication alleviates the pain. No other complaints today ° ° °PMH: °Past Medical History:  °Diagnosis Date  ° Chest pain   ° Smoker 12/20/2019  ° ° °Surgical History: °Past Surgical History:  °Procedure Laterality Date  ° TONSILLECTOMY    ° TONSILLECTOMY AND ADENOIDECTOMY    ° ° °Home Medications:  °Allergies as of 12/17/2021   ° °   Reactions  ° Pollen Extract   ° °  ° °  °Medication List  °  ° °  ° Accurate as of December 17, 2021  2:26 PM. If you have any questions, ask your nurse or doctor.  °  °  ° °  ° °buPROPion 150 MG 24 hr tablet °Commonly known as: WELLBUTRIN XL °Take 150 mg by mouth daily. °  °cyclobenzaprine 5 MG tablet °Commonly known as: FLEXERIL °Take 5 mg by mouth 3 (three) times daily as needed. °  °diclofenac 50 MG EC tablet °Commonly known as: VOLTAREN °Take 50 mg by mouth 2 (two) times daily. °  °dicyclomine 10 MG capsule °Commonly known as: BENTYL °Take 10 mg by mouth every 6 (six) hours as needed. °  °HYDROcodone-acetaminophen 5-325 MG tablet °Commonly known as: NORCO/VICODIN °Take 1 tablet by mouth every 6 (six) hours as needed. °  °ibuprofen 600 MG tablet °Commonly known as: ADVIL °Take 1 tablet (600 mg total) by mouth every 8 (eight) hours as needed for mild pain or moderate pain. °  °melatonin 1 MG Tabs tablet °Take 1 mg by mouth at bedtime. °  °oxyCODONE-acetaminophen 5-325 MG tablet °Commonly known as: Percocet °Take 1 tablet by mouth every 4 (four) hours as needed. °  °tamsulosin 0.4 MG Caps capsule °Commonly known as:  FLOMAX °Take 1 capsule (0.4 mg total) by mouth daily. °  °traMADol 50 MG tablet °Commonly known as: ULTRAM °Take by mouth. °  °traZODone 50 MG tablet °Commonly known as: DESYREL °Take 0.5-1 tablets (25-50 mg total) by mouth at bedtime as needed for sleep. °  ° °  ° ° °Allergies:  °Allergies  °Allergen Reactions  ° Pollen Extract   ° ° °Family History: °Family History  °Problem Relation Age of Onset  ° Hypertension Mother   ° Healthy Father   ° Drug abuse Maternal Grandmother   ° ° °Social History:  reports that he has been smoking. He has been smoking an average of 1 pack per day. He has never used smokeless tobacco. He reports current alcohol use. He reports that he does not use drugs. ° °ROS: °All other review of systems were reviewed and are negative except what is noted above in HPI ° °Physical Exam: °BP (!) 93/54    Pulse 75   °Constitutional:  Alert and oriented, No acute distress. °HEENT: [place] AT, moist mucus membranes.  Trachea midline, no masses. °Cardiovascular: No clubbing, cyanosis, or edema. °Respiratory: Normal respiratory effort, no increased work of breathing. °GI: Abdomen is soft, nontender, nondistended, no abdominal masses °GU: No CVA tenderness.  °Lymph: No cervical or inguinal lymphadenopathy. °Skin: No rashes, bruises or suspicious lesions. °Neurologic:   Grossly intact, no focal deficits, moving all 4 extremities. °Psychiatric: Normal mood and affect. ° °Laboratory Data: °Lab Results  °Component Value Date  ° WBC 6.2 07/10/2020  ° HGB 15.9 07/10/2020  ° HCT 46.1 07/10/2020  ° MCV 92.2 07/10/2020  ° PLT 137 (L) 07/10/2020  ° ° °Lab Results  °Component Value Date  ° CREATININE 0.78 07/10/2020  ° ° °No results found for: PSA ° °No results found for: TESTOSTERONE ° °No results found for: HGBA1C ° °Urinalysis °   °Component Value Date/Time  ° APPEARANCEUR Clear 12/07/2021 0951  ° GLUCOSEU Negative 12/07/2021 0951  ° BILIRUBINUR Negative 12/07/2021 0951  ° PROTEINUR Negative 12/07/2021 0951  ° NITRITE  Negative 12/07/2021 0951  ° LEUKOCYTESUR Negative 12/07/2021 0951  ° ° °Lab Results  °Component Value Date  ° LABMICR See below: 12/07/2021  ° WBCUA None seen 12/07/2021  ° LABEPIT None seen 12/07/2021  ° BACTERIA None seen 12/07/2021  ° ° °Pertinent Imaging: °KUb today: Images reviewed and discussed with the patient  °No results found for this or any previous visit. ° °No results found for this or any previous visit. ° °No results found for this or any previous visit. ° °No results found for this or any previous visit. ° °No results found for this or any previous visit. ° °No results found for this or any previous visit. ° °No results found for this or any previous visit. ° °No results found for this or any previous visit. ° ° °Assessment & Plan:   ° °1. Kidney stones °-We discussed the management of kidney stones. These options include observation, ureteroscopy, shockwave lithotripsy (ESWL) and percutaneous nephrolithotomy (PCNL). We discussed which options are relevant to the patient's stone(s). We discussed the natural history of kidney stones as well as the complications of untreated stones and the impact on quality of life without treatment as well as with each of the above listed treatments. We also discussed the efficacy of each treatment in its ability to clear the stone burden. With any of these management options I discussed the signs and symptoms of infection and the need for emergent treatment should these be experienced. For each option we discussed the ability of each procedure to clear the patient of their stone burden.  ° °For observation I described the risks which include but are not limited to silent renal damage, life-threatening infection, need for emergent surgery, failure to pass stone and pain.  ° °For ureteroscopy I described the risks which include bleeding, infection, damage to contiguous structures, positioning injury, ureteral stricture, ureteral avulsion, ureteral injury, need for  prolonged ureteral stent, inability to perform ureteroscopy, need for an interval procedure, inability to clear stone burden, stent discomfort/pain, heart attack, stroke, pulmonary embolus and the inherent risks with general anesthesia.  ° °For shockwave lithotripsy I described the risks which include arrhythmia, kidney contusion, kidney hemorrhage, need for transfusion, pain, inability to adequately break up stone, inability to pass stone fragments, Steinstrasse, infection associated with obstructing stones, need for alternate surgical procedure, need for repeat shockwave lithotripsy, MI, CVA, PE and the inherent risks with anesthesia/conscious sedation.  ° °For PCNL I described the risks including positioning injury, pneumothorax, hydrothorax, need for chest tube, inability to clear stone burden, renal laceration, arterial venous fistula or malformation, need for embolization of kidney, loss of kidney or renal function, need for repeat procedure, need for prolonged nephrostomy tube, ureteral avulsion, MI, CVA, PE and the inherent risks of general anesthesia.  ° °- The patient would   like to proceed with right ureteroscopic stone extraction ° ° ° °No follow-ups on file. ° °Milady Fleener, MD ° °[facility] [place] °  °

## 2021-12-17 NOTE — Progress Notes (Signed)
12/17/2021 2:26 PM   Russell Huynh July 15, 1996 [id number]  Referring provider: No referring provider defined for this encounter.  Followup nephrolithiasis and nocturia   HPI: Russell Huynh is a 26yo here for followup for nephrolithiasis and nocturia. Nocturia improved to 1x since last visit. He has not passed any calculi since last visit. He continues to have intermittent right flank pain that is sharp, intermittent, mild to moderate, and radiates to his groin. Pain medication alleviates the pain. No other complaints today   PMH: Past Medical History:  Diagnosis Date   Chest pain    Smoker 12/20/2019    Surgical History: Past Surgical History:  Procedure Laterality Date   TONSILLECTOMY     TONSILLECTOMY AND ADENOIDECTOMY      Home Medications:  Allergies as of 12/17/2021       Reactions   Pollen Extract         Medication List        Accurate as of December 17, 2021  2:26 PM. If you have any questions, ask your nurse or doctor.          buPROPion 150 MG 24 hr tablet Commonly known as: WELLBUTRIN XL Take 150 mg by mouth daily.   cyclobenzaprine 5 MG tablet Commonly known as: FLEXERIL Take 5 mg by mouth 3 (three) times daily as needed.   diclofenac 50 MG EC tablet Commonly known as: VOLTAREN Take 50 mg by mouth 2 (two) times daily.   dicyclomine 10 MG capsule Commonly known as: BENTYL Take 10 mg by mouth every 6 (six) hours as needed.   HYDROcodone-acetaminophen 5-325 MG tablet Commonly known as: NORCO/VICODIN Take 1 tablet by mouth every 6 (six) hours as needed.   ibuprofen 600 MG tablet Commonly known as: ADVIL Take 1 tablet (600 mg total) by mouth every 8 (eight) hours as needed for mild pain or moderate pain.   melatonin 1 MG Tabs tablet Take 1 mg by mouth at bedtime.   oxyCODONE-acetaminophen 5-325 MG tablet Commonly known as: Percocet Take 1 tablet by mouth every 4 (four) hours as needed.   tamsulosin 0.4 MG Caps capsule Commonly known as:  FLOMAX Take 1 capsule (0.4 mg total) by mouth daily.   traMADol 50 MG tablet Commonly known as: ULTRAM Take by mouth.   traZODone 50 MG tablet Commonly known as: DESYREL Take 0.5-1 tablets (25-50 mg total) by mouth at bedtime as needed for sleep.        Allergies:  Allergies  Allergen Reactions   Pollen Extract     Family History: Family History  Problem Relation Age of Onset   Hypertension Mother    Healthy Father    Drug abuse Maternal Grandmother     Social History:  reports that he has been smoking. He has been smoking an average of 1 pack per day. He has never used smokeless tobacco. He reports current alcohol use. He reports that he does not use drugs.  ROS: All other review of systems were reviewed and are negative except what is noted above in HPI  Physical Exam: BP (!) 93/54    Pulse 75   Constitutional:  Alert and oriented, No acute distress. HEENT: Russell Huynh AT, moist mucus membranes.  Trachea midline, no masses. Cardiovascular: No clubbing, cyanosis, or edema. Respiratory: Normal respiratory effort, no increased work of breathing. GI: Abdomen is soft, nontender, nondistended, no abdominal masses GU: No CVA tenderness.  Lymph: No cervical or inguinal lymphadenopathy. Skin: No rashes, bruises or suspicious lesions. Neurologic:  Grossly intact, no focal deficits, moving all 4 extremities. Psychiatric: Normal mood and affect.  Laboratory Data: Lab Results  Component Value Date   WBC 6.2 07/10/2020   HGB 15.9 07/10/2020   HCT 46.1 07/10/2020   MCV 92.2 07/10/2020   PLT 137 (L) 07/10/2020    Lab Results  Component Value Date   CREATININE 0.78 07/10/2020    No results found for: PSA  No results found for: TESTOSTERONE  No results found for: HGBA1C  Urinalysis    Component Value Date/Time   APPEARANCEUR Clear 12/07/2021 0951   GLUCOSEU Negative 12/07/2021 0951   BILIRUBINUR Negative 12/07/2021 0951   PROTEINUR Negative 12/07/2021 0951   NITRITE  Negative 12/07/2021 0951   LEUKOCYTESUR Negative 12/07/2021 0951    Lab Results  Component Value Date   LABMICR See below: 12/07/2021   WBCUA None seen 12/07/2021   LABEPIT None seen 12/07/2021   BACTERIA None seen 12/07/2021    Pertinent Imaging: KUb today: Images reviewed and discussed with the patient  No results found for this or any previous visit.  No results found for this or any previous visit.  No results found for this or any previous visit.  No results found for this or any previous visit.  No results found for this or any previous visit.  No results found for this or any previous visit.  No results found for this or any previous visit.  No results found for this or any previous visit.   Assessment & Plan:    1. Kidney stones -We discussed the management of kidney stones. These options include observation, ureteroscopy, shockwave lithotripsy (ESWL) and percutaneous nephrolithotomy (PCNL). We discussed which options are relevant to the patient's stone(s). We discussed the natural history of kidney stones as well as the complications of untreated stones and the impact on quality of life without treatment as well as with each of the above listed treatments. We also discussed the efficacy of each treatment in its ability to clear the stone burden. With any of these management options I discussed the signs and symptoms of infection and the need for emergent treatment should these be experienced. For each option we discussed the ability of each procedure to clear the patient of their stone burden.   For observation I described the risks which include but are not limited to silent renal damage, life-threatening infection, need for emergent surgery, failure to pass stone and pain.   For ureteroscopy I described the risks which include bleeding, infection, damage to contiguous structures, positioning injury, ureteral stricture, ureteral avulsion, ureteral injury, need for  prolonged ureteral stent, inability to perform ureteroscopy, need for an interval procedure, inability to clear stone burden, stent discomfort/pain, heart attack, stroke, pulmonary embolus and the inherent risks with general anesthesia.   For shockwave lithotripsy I described the risks which include arrhythmia, kidney contusion, kidney hemorrhage, need for transfusion, pain, inability to adequately break up stone, inability to pass stone fragments, Steinstrasse, infection associated with obstructing stones, need for alternate surgical procedure, need for repeat shockwave lithotripsy, MI, CVA, PE and the inherent risks with anesthesia/conscious sedation.   For PCNL I described the risks including positioning injury, pneumothorax, hydrothorax, need for chest tube, inability to clear stone burden, renal laceration, arterial venous fistula or malformation, need for embolization of kidney, loss of kidney or renal function, need for repeat procedure, need for prolonged nephrostomy tube, ureteral avulsion, MI, CVA, PE and the inherent risks of general anesthesia.   - The patient would  like to proceed with right ureteroscopic stone extraction    No follow-ups on file.  [name], MD  [facility] [place]

## 2021-12-20 ENCOUNTER — Telehealth: Payer: Self-pay

## 2021-12-20 NOTE — Telephone Encounter (Signed)
I spoke with Russell Huynh. We have discussed possible surgery dates and Thursday March 30th, 2023 was agreed upon by all parties. Patient given information about surgery date, what to expect pre-operatively and post operatively.  ? ?We discussed that a pre-op nurse will be calling to set up the pre-op visit that will take place prior to surgery. Informed patient that our office will communicate any additional care to be provided after surgery. Patients questions or concerns were discussed during our call.  ? ?Advised to call our office should there be any additional information, questions or concerns that arise. Patient verbalized understanding.  ? ?

## 2021-12-20 NOTE — Progress Notes (Signed)
[facility] [place] Surgery Posting Form   Surgery Date/Time: Date: 01/07/2022  Surgeon: Dr. Wilkie Aye, MD  Surgery Location: Day Surgery  Inpt ( No  )   Outpt (Yes)   Obs ( No  )   Diagnosis: N20.0 Right Renal Calculi  -CPT: 84696  Surgery: Right Ureteroscopy with laser lithotripsy and stent placement  Stop Anticoagulations: Yes  Cardiac/Medical/Pulmonary Clearance needed: no  *Orders entered into EPIC  Date: 12/20/21   *Case booked in [place]  Date: 12/19/2021  *Notified pt of Surgery: Date: 12/19/2021  PRE-OP UA & CX: no  *Placed into Prior Authorization Work Que Date: 12/20/21   Assistant/laser/rep:No

## 2021-12-21 ENCOUNTER — Ambulatory Visit: Payer: BC Managed Care – PPO | Admitting: Urology

## 2021-12-21 ENCOUNTER — Telehealth: Payer: Self-pay

## 2021-12-22 ENCOUNTER — Encounter: Payer: Self-pay | Admitting: Urology

## 2021-12-22 NOTE — Patient Instructions (Signed)
Dietary Guidelines to Help Prevent Kidney Stones Kidney stones are deposits of minerals and salts that form inside your kidneys. Your risk of developing kidney stones may be greater depending on your diet, your lifestyle, the medicines you take, and whether you have certain medical conditions. Most people can lower their chances of developing kidney stones by following the instructions below. Your dietitian may give you more specific instructions depending on your overall health and the type of kidney stones you tend to develop. What are tips for following this plan? Reading food labels  Choose foods with "no salt added" or "low-salt" labels. Limit your salt (sodium) intake to less than 1,500 mg a day. Choose foods with calcium for each meal and snack. Try to eat about 300 mg of calcium at each meal. Foods that contain 200-500 mg of calcium a serving include: 8 oz (237 mL) of milk, calcium-fortifiednon-dairy milk, and calcium-fortifiedfruit juice. Calcium-fortified means that calcium has been added to these drinks. 8 oz (237 mL) of kefir, yogurt, and soy yogurt. 4 oz (114 g) of tofu. 1 oz (28 g) of cheese. 1 cup (150 g) of dried figs. 1 cup (91 g) of cooked broccoli. One 3 oz (85 g) can of sardines or mackerel. Most people need 1,000-1,500 mg of calcium a day. Talk to your dietitian about how much calcium is recommended for you. Shopping Buy plenty of fresh fruits and vegetables. Most people do not need to avoid fruits and vegetables, even if these foods contain nutrients that may contribute to kidney stones. When shopping for convenience foods, choose: Whole pieces of fruit. Pre-made salads with dressing on the side. Low-fat fruit and yogurt smoothies. Avoid buying frozen meals or prepared deli foods. These can be high in sodium. Look for foods with live cultures, such as yogurt and kefir. Choose high-fiber grains, such as whole-wheat breads, oat bran, and wheat cereals. Cooking Do not add  salt to food when cooking. Place a salt shaker on the table and allow each person to add his or her own salt to taste. Use vegetable protein, such as beans, textured vegetable protein (TVP), or tofu, instead of meat in pasta, casseroles, and soups. Meal planning Eat less salt, if told by your dietitian. To do this: Avoid eating processed or pre-made food. Avoid eating fast food. Eat less animal protein, including cheese, meat, poultry, or fish, if told by your dietitian. To do this: Limit the number of times you have meat, poultry, fish, or cheese each week. Eat a diet free of meat at least 2 days a week. Eat only one serving each day of meat, poultry, fish, or seafood. When you prepare animal protein, cut pieces into small portion sizes. For most meat and fish, one serving is about the size of the palm of your hand. Eat at least five servings of fresh fruits and vegetables each day. To do this: Keep fruits and vegetables on hand for snacks. Eat one piece of fruit or a handful of berries with breakfast. Have a salad and fruit at lunch. Have two kinds of vegetables at dinner. Limit foods that are high in a substance called oxalate. These include: Spinach (cooked), rhubarb, beets, sweet potatoes, and Swiss chard. Peanuts. Potato chips, french fries, and baked potatoes with skin on. Nuts and nut products. Chocolate. If you regularly take a diuretic medicine, make sure to eat at least 1 or 2 servings of fruits or vegetables that are high in potassium each day. These include: Avocado. Banana. Orange, prune,   carrot, or tomato juice. Baked potato. Cabbage. Beans and split peas. Lifestyle  Drink enough fluid to keep your urine pale yellow. This is the most important thing you can do. Spread your fluid intake throughout the day. If you drink alcohol: Limit how much you use to: 0-1 drink a day for women who are not pregnant. 0-2 drinks a day for men. Be aware of how much alcohol is in your  drink. In the [place] one drink equals one 12 oz bottle of beer (355 mL), one 5 oz glass of wine (148 mL), or one 1 oz glass of hard liquor (44 mL). Lose weight if told by your health care provider. Work with your dietitian to find an eating plan and weight loss strategies that work best for you. General information Talk to your health care provider and dietitian about taking daily supplements. You may be told the following depending on your health and the cause of your kidney stones: Not to take supplements with vitamin C. To take a calcium supplement. To take a daily probiotic supplement. To take other supplements such as magnesium, fish oil, or vitamin B6. Take over-the-counter and prescription medicines only as told by your health care provider. These include supplements. What foods should I limit? Limit your intake of the following foods, or eat them as told by your dietitian. Vegetables Spinach. Rhubarb. Beets. Canned vegetables. Pickles. Olives. Baked potatoes with skin. Grains Wheat bran. Baked goods. Salted crackers. Cereals high in sugar. Meats and other proteins Nuts. Nut butters. Large portions of meat, poultry, or fish. Salted, precooked, or cured meats, such as sausages, meat loaves, and hot dogs. Dairy Cheese. Beverages Regular soft drinks. Regular vegetable juice. Seasonings and condiments Seasoning blends with salt. Salad dressings. Soy sauce. Ketchup. Barbecue sauce. Other foods Canned soups. Canned pasta sauce. Casseroles. Pizza. Lasagna. Frozen meals. Potato chips. French fries. The items listed above may not be a complete list of foods and beverages you should limit. Contact a dietitian for more information. What foods should I avoid? Talk to your dietitian about specific foods you should avoid based on the type of kidney stones you have and your overall health. Fruits Grapefruit. The item listed above may not be a complete list of foods and beverages you should  avoid. Contact a dietitian for more information. Summary Kidney stones are deposits of minerals and salts that form inside your kidneys. You can lower your risk of kidney stones by making changes to your diet. The most important thing you can do is drink enough fluid. Drink enough fluid to keep your urine pale yellow. Talk to your dietitian about how much calcium you should have each day, and eat less salt and animal protein as told by your dietitian. This information is not intended to replace advice given to you by your health care provider. Make sure you discuss any questions you have with your health care provider. Document Revised: 09/30/2019 Document Reviewed: 09/30/2019 Elsevier Patient Education  2022 [employer]  

## 2021-12-24 NOTE — Patient Instructions (Signed)
Russell Huynh  12/24/2021     @PREFPERIOPPHARMACY @   Your procedure is scheduled on  12/31/2021.   Report to [facility] at  0600  A.M.   Call this number if you have problems the morning of surgery:  [phone]   Remember:  Do not eat or drink after midnight.      Take these medicines the morning of surgery with A SIP OF WATER        flexeril(if needed), oxycodone or tranadol(if needed), flomax.     Do not wear jewelry, make-up or nail polish.  Do not wear lotions, powders, or perfumes, or deodorant.  Do not shave 48 hours prior to surgery.  Men may shave face and neck.  Do not bring valuables to the hospital.  [facility] is not responsible for any belongings or valuables.  Contacts, dentures or bridgework may not be worn into surgery.  Leave your suitcase in the car.  After surgery it may be brought to your room.  For patients admitted to the hospital, discharge time will be determined by your treatment team.  Patients discharged the day of surgery will not be allowed to drive home and must have someone with them for 24 hours.    Special instructions:   DO NOT smoke tobacco or vape for 24 hours before your procedure.  Please read over the following fact sheets that you were given. Coughing and Deep Breathing, Surgical Site Infection Prevention, Anesthesia Post-op Instructions, and Care and Recovery After Surgery      Ureteral Stent Implantation, Care After This sheet gives you information about how to care for yourself after your procedure. Your health care provider may also give you more specific instructions. If you have problems or questions, contact your health care provider. What can I expect after the procedure? After the procedure, it is common to have: Nausea. Mild pain when you urinate. You may feel this pain in your lower back or lower abdomen. The pain should stop within a few minutes after you urinate. This may last for up to 1 week. A  small amount of blood in your urine for several days. Follow these instructions at home: Medicines Take over-the-counter and prescription medicines only as told by your health care provider. If you were prescribed an antibiotic medicine, take it as told by your health care provider. Do not stop taking the antibiotic even if you start to feel better. Do not drive for 24 hours if you were given a sedative during your procedure. Ask your health care provider if the medicine prescribed to you requires you to avoid driving or using heavy machinery. Activity Rest as told by your health care provider. Avoid sitting for a long time without moving. Get up to take short walks every 1-2 hours. This is important to improve blood flow and breathing. Ask for help if you feel weak or unsteady. Return to your normal activities as told by your health care provider. Ask your health care provider what activities are safe for you. General instructions  Watch for any blood in your urine. Call your health care provider if the amount of blood in your urine increases. If you have a catheter: Follow instructions from your health care provider about taking care of your catheter and collection bag. Do not take baths, swim, or use a hot tub until your health care provider approves. Ask your health care provider if you may take showers. You may only  be allowed to take sponge baths. Drink enough fluid to keep your urine pale yellow. Do not use any products that contain nicotine or tobacco, such as cigarettes, e-cigarettes, and chewing tobacco. These can delay healing after surgery. If you need help quitting, ask your health care provider. Keep all follow-up visits as told by your health care provider. This is important. Contact a health care provider if: You have pain that gets worse or does not get better with medicine, especially pain when you urinate. You have difficulty urinating. You feel nauseous or you vomit  repeatedly during a period of more than 2 days after the procedure. Get help right away if: Your urine is dark red or has blood clots in it. You are leaking urine (have incontinence). The end of the stent comes out of your urethra. You cannot urinate. You have sudden, sharp, or severe pain in your abdomen or lower back. You have a fever. You have swelling or pain in your legs. You have difficulty breathing. Summary After the procedure, it is common to have mild pain when you urinate that goes away within a few minutes after you urinate. This may last for up to 1 week. Watch for any blood in your urine. Call your health care provider if the amount of blood in your urine increases. Take over-the-counter and prescription medicines only as told by your health care provider. Drink enough fluid to keep your urine pale yellow. This information is not intended to replace advice given to you by your health care provider. Make sure you discuss any questions you have with your health care provider. Document Revised: 07/14/2018 Document Reviewed: 07/15/2018 Elsevier Patient Education  2022 [place] Anesthesia, Adult, Care After This sheet gives you information about how to care for yourself after your procedure. Your health care provider may also give you more specific instructions. If you have problems or questions, contact your health care provider. What can I expect after the procedure? After the procedure, the following side effects are common: Pain or discomfort at the IV site. Nausea. Vomiting. Sore throat. Trouble concentrating. Feeling cold or chills. Feeling weak or tired. Sleepiness and fatigue. Soreness and body aches. These side effects can affect parts of the body that were not involved in surgery. Follow these instructions at home: For the time period you were told by your health care provider:  Rest. Do not participate in activities where you could fall or become  injured. Do not drive or use machinery. Do not drink alcohol. Do not take sleeping pills or medicines that cause drowsiness. Do not make important decisions or sign legal documents. Do not take care of children on your own. Eating and drinking Follow any instructions from your health care provider about eating or drinking restrictions. When you feel hungry, start by eating small amounts of foods that are soft and easy to digest (bland), such as toast. Gradually return to your regular diet. Drink enough fluid to keep your urine pale yellow. If you vomit, rehydrate by drinking water, juice, or clear broth. General instructions If you have sleep apnea, surgery and certain medicines can increase your risk for breathing problems. Follow instructions from your health care provider about wearing your sleep device: Anytime you are sleeping, including during daytime naps. While taking prescription pain medicines, sleeping medicines, or medicines that make you drowsy. Have a responsible adult stay with you for the time you are told. It is important to have someone help care for you until you  are awake and alert. Return to your normal activities as told by your health care provider. Ask your health care provider what activities are safe for you. Take over-the-counter and prescription medicines only as told by your health care provider. If you smoke, do not smoke without supervision. Keep all follow-up visits as told by your health care provider. This is important. Contact a health care provider if: You have nausea or vomiting that does not get better with medicine. You cannot eat or drink without vomiting. You have pain that does not get better with medicine. You are unable to pass urine. You develop a skin rash. You have a fever. You have redness around your IV site that gets worse. Get help right away if: You have difficulty breathing. You have chest pain. You have blood in your urine or stool,  or you vomit blood. Summary After the procedure, it is common to have a sore throat or nausea. It is also common to feel tired. Have a responsible adult stay with you for the time you are told. It is important to have someone help care for you until you are awake and alert. When you feel hungry, start by eating small amounts of foods that are soft and easy to digest (bland), such as toast. Gradually return to your regular diet. Drink enough fluid to keep your urine pale yellow. Return to your normal activities as told by your health care provider. Ask your health care provider what activities are safe for you. This information is not intended to replace advice given to you by your health care provider. Make sure you discuss any questions you have with your health care provider. Document Revised: 06/22/2020 Document Reviewed: 01/20/2020 Elsevier Patient Education  2022 [employer]. How to Use Chlorhexidine for Bathing Chlorhexidine gluconate (CHG) is a germ-killing (antiseptic) solution that is used to clean the skin. It can get rid of the bacteria that normally live on the skin and can keep them away for about 24 hours. To clean your skin with CHG, you may be given: A CHG solution to use in the shower or as part of a sponge bath. A prepackaged cloth that contains CHG. Cleaning your skin with CHG may help lower the risk for infection: While you are staying in the intensive care unit of the hospital. If you have a vascular access, such as a central line, to provide short-term or long-term access to your veins. If you have a catheter to drain urine from your bladder. If you are on a ventilator. A ventilator is a machine that helps you breathe by moving air in and out of your lungs. After surgery. What are the risks? Risks of using CHG include: A skin reaction. Hearing loss, if CHG gets in your ears and you have a perforated eardrum. Eye injury, if CHG gets in your eyes and is not rinsed  out. The CHG product catching fire. Make sure that you avoid smoking and flames after applying CHG to your skin. Do not use CHG: If you have a chlorhexidine allergy or have previously reacted to chlorhexidine. On babies younger than 7 months of age. How to use CHG solution Use CHG only as told by your health care provider, and follow the instructions on the label. Use the full amount of CHG as directed. Usually, this is one bottle. During a shower Follow these steps when using CHG solution during a shower (unless your health care provider gives you different instructions): Start the shower. Use your  normal soap and shampoo to wash your face and hair. Turn off the shower or move out of the shower stream. Pour the CHG onto a clean washcloth. Do not use any type of brush or rough-edged sponge. Starting at your neck, lather your body down to your toes. Make sure you follow these instructions: If you will be having surgery, pay special attention to the part of your body where you will be having surgery. Scrub this area for at least 1 minute. Do not use CHG on your head or face. If the solution gets into your ears or eyes, rinse them well with water. Avoid your genital area. Avoid any areas of skin that have broken skin, cuts, or scrapes. Scrub your back and under your arms. Make sure to wash skin folds. Let the lather sit on your skin for 1-2 minutes or as long as told by your health care provider. Thoroughly rinse your entire body in the shower. Make sure that all body creases and crevices are rinsed well. Dry off with a clean towel. Do not put any substances on your body afterward--such as powder, lotion, or perfume--unless you are told to do so by your health care provider. Only use lotions that are recommended by the manufacturer. Put on clean clothes or pajamas. If it is the night before your surgery, sleep in clean sheets.  During a sponge bath Follow these steps when using CHG solution  during a sponge bath (unless your health care provider gives you different instructions): Use your normal soap and shampoo to wash your face and hair. Pour the CHG onto a clean washcloth. Starting at your neck, lather your body down to your toes. Make sure you follow these instructions: If you will be having surgery, pay special attention to the part of your body where you will be having surgery. Scrub this area for at least 1 minute. Do not use CHG on your head or face. If the solution gets into your ears or eyes, rinse them well with water. Avoid your genital area. Avoid any areas of skin that have broken skin, cuts, or scrapes. Scrub your back and under your arms. Make sure to wash skin folds. Let the lather sit on your skin for 1-2 minutes or as long as told by your health care provider. Using a different clean, wet washcloth, thoroughly rinse your entire body. Make sure that all body creases and crevices are rinsed well. Dry off with a clean towel. Do not put any substances on your body afterward--such as powder, lotion, or perfume--unless you are told to do so by your health care provider. Only use lotions that are recommended by the manufacturer. Put on clean clothes or pajamas. If it is the night before your surgery, sleep in clean sheets. How to use CHG prepackaged cloths Only use CHG cloths as told by your health care provider, and follow the instructions on the label. Use the CHG cloth on clean, dry skin. Do not use the CHG cloth on your head or face unless your health care provider tells you to. When washing with the CHG cloth: Avoid your genital area. Avoid any areas of skin that have broken skin, cuts, or scrapes. Before surgery Follow these steps when using a CHG cloth to clean before surgery (unless your health care provider gives you different instructions): Using the CHG cloth, vigorously scrub the part of your body where you will be having surgery. Scrub using a  back-and-forth motion for 3 minutes. The area  on your body should be completely wet with CHG when you are done scrubbing. Do not rinse. Discard the cloth and let the area air-dry. Do not put any substances on the area afterward, such as powder, lotion, or perfume. Put on clean clothes or pajamas. If it is the night before your surgery, sleep in clean sheets.  For general bathing Follow these steps when using CHG cloths for general bathing (unless your health care provider gives you different instructions). Use a separate CHG cloth for each area of your body. Make sure you wash between any folds of skin and between your fingers and toes. Wash your body in the following order, switching to a new cloth after each step: The front of your neck, shoulders, and chest. Both of your arms, under your arms, and your hands. Your stomach and groin area, avoiding the genitals. Your right leg and foot. Your left leg and foot. The back of your neck, your back, and your buttocks. Do not rinse. Discard the cloth and let the area air-dry. Do not put any substances on your body afterward--such as powder, lotion, or perfume--unless you are told to do so by your health care provider. Only use lotions that are recommended by the manufacturer. Put on clean clothes or pajamas. Contact a health care provider if: Your skin gets irritated after scrubbing. You have questions about using your solution or cloth. You swallow any chlorhexidine. Call your local poison control center (1-365-516-7834 in the [place] Get help right away if: Your eyes itch badly, or they become very red or swollen. Your skin itches badly and is red or swollen. Your hearing changes. You have trouble seeing. You have swelling or tingling in your mouth or throat. You have trouble breathing. These symptoms may represent a serious problem that is an emergency. Do not wait to see if the symptoms will go away. Get medical help right away. Call your  local emergency services (911 in the [place] Do not drive yourself to the hospital. Summary Chlorhexidine gluconate (CHG) is a germ-killing (antiseptic) solution that is used to clean the skin. Cleaning your skin with CHG may help to lower your risk for infection. You may be given CHG to use for bathing. It may be in a bottle or in a prepackaged cloth to use on your skin. Carefully follow your health care provider's instructions and the instructions on the product label. Do not use CHG if you have a chlorhexidine allergy. Contact your health care provider if your skin gets irritated after scrubbing. This information is not intended to replace advice given to you by your health care provider. Make sure you discuss any questions you have with your health care provider. Document Revised: 12/18/2020 Document Reviewed: 12/18/2020 Elsevier Patient Education  2022 [employer].

## 2021-12-27 ENCOUNTER — Encounter (HOSPITAL_COMMUNITY)
Admission: RE | Admit: 2021-12-27 | Discharge: 2021-12-27 | Disposition: A | Discharge status: Home / Self Care | Payer: BC Managed Care – PPO | Source: Ambulatory Visit | Attending: Urology | Admitting: Urology

## 2021-12-27 ENCOUNTER — Encounter (HOSPITAL_COMMUNITY): Payer: Self-pay

## 2021-12-27 DIAGNOSIS — Z01810 Encounter for preprocedural cardiovascular examination: Secondary | ICD-10-CM | POA: Diagnosis present

## 2021-12-27 HISTORY — DX: Anxiety disorder, unspecified: F41.9

## 2021-12-27 HISTORY — DX: Unspecified asthma, uncomplicated: J45.909

## 2021-12-27 HISTORY — DX: Personal history of urinary calculi: Z87.442

## 2021-12-27 NOTE — Telephone Encounter (Signed)
Opened in error

## 2021-12-31 ENCOUNTER — Ambulatory Visit (HOSPITAL_COMMUNITY): Payer: BC Managed Care – PPO | Admitting: Anesthesiology

## 2021-12-31 ENCOUNTER — Encounter (HOSPITAL_COMMUNITY): Payer: Self-pay | Admitting: Urology

## 2021-12-31 ENCOUNTER — Ambulatory Visit (HOSPITAL_COMMUNITY)
Admission: RE | Admit: 2021-12-31 | Discharge: 2021-12-31 | Disposition: A | Discharge status: Home / Self Care | Payer: BC Managed Care – PPO | Attending: Urology | Admitting: Urology

## 2021-12-31 ENCOUNTER — Ambulatory Visit (HOSPITAL_COMMUNITY): Payer: BC Managed Care – PPO

## 2021-12-31 ENCOUNTER — Encounter (HOSPITAL_COMMUNITY)
Admission: RE | Disposition: A | Discharge status: Home / Self Care | Payer: Self-pay | Source: Home / Self Care | Attending: Urology

## 2021-12-31 ENCOUNTER — Other Ambulatory Visit: Payer: Self-pay

## 2021-12-31 DIAGNOSIS — F17200 Nicotine dependence, unspecified, uncomplicated: Secondary | ICD-10-CM | POA: Insufficient documentation

## 2021-12-31 DIAGNOSIS — J45909 Unspecified asthma, uncomplicated: Secondary | ICD-10-CM | POA: Insufficient documentation

## 2021-12-31 DIAGNOSIS — N201 Calculus of ureter: Secondary | ICD-10-CM

## 2021-12-31 DIAGNOSIS — N200 Calculus of kidney: Secondary | ICD-10-CM

## 2021-12-31 DIAGNOSIS — F419 Anxiety disorder, unspecified: Secondary | ICD-10-CM | POA: Diagnosis not present

## 2021-12-31 SURGERY — CYSTOURETEROSCOPY, WITH RETROGRADE PYELOGRAM AND STENT INSERTION
Anesthesia: General | Site: Ureter | Laterality: Right

## 2021-12-31 MED ORDER — PHENYLEPHRINE 40 MCG/ML (10ML) SYRINGE FOR IV PUSH (FOR BLOOD PRESSURE SUPPORT)
PREFILLED_SYRINGE | INTRAVENOUS | Status: AC
  Filled 2021-12-31: qty 20

## 2021-12-31 MED ORDER — FENTANYL CITRATE PF 50 MCG/ML IJ SOSY: 25.0000 ug | PREFILLED_SYRINGE | INTRAMUSCULAR | Status: DC | PRN

## 2021-12-31 MED ORDER — PROPOFOL 10 MG/ML IV BOLUS
INTRAVENOUS | Status: AC
  Filled 2021-12-31: qty 20

## 2021-12-31 MED ORDER — FENTANYL CITRATE (PF) 100 MCG/2ML IJ SOLN
INTRAMUSCULAR | Status: DC | PRN
  Administered 2021-12-31: 50 ug via INTRAVENOUS

## 2021-12-31 MED ORDER — PROPOFOL 10 MG/ML IV BOLUS
INTRAVENOUS | Status: DC | PRN
  Administered 2021-12-31: 200 mg via INTRAVENOUS

## 2021-12-31 MED ORDER — ONDANSETRON HCL 4 MG/2ML IJ SOLN
INTRAMUSCULAR | Status: DC | PRN
  Administered 2021-12-31: 4 mg via INTRAVENOUS

## 2021-12-31 MED ORDER — MIDAZOLAM HCL 2 MG/2ML IJ SOLN
INTRAMUSCULAR | Status: AC
  Filled 2021-12-31: qty 2

## 2021-12-31 MED ORDER — ROCURONIUM BROMIDE 10 MG/ML (PF) SYRINGE
PREFILLED_SYRINGE | INTRAVENOUS | Status: AC
  Filled 2021-12-31: qty 10

## 2021-12-31 MED ORDER — LIDOCAINE HCL (PF) 2 % IJ SOLN
INTRAMUSCULAR | Status: AC
  Filled 2021-12-31: qty 5

## 2021-12-31 MED ORDER — ONDANSETRON HCL 4 MG/2ML IJ SOLN: 4.0000 mg | Freq: Once | INTRAMUSCULAR | Status: DC | PRN

## 2021-12-31 MED ORDER — FENTANYL CITRATE (PF) 250 MCG/5ML IJ SOLN
INTRAMUSCULAR | Status: AC
  Filled 2021-12-31: qty 5

## 2021-12-31 MED ORDER — WATER FOR IRRIGATION, STERILE IR SOLN
Status: DC | PRN
  Administered 2021-12-31: 1000 mL

## 2021-12-31 MED ORDER — LIDOCAINE HCL (CARDIAC) PF 50 MG/5ML IV SOSY
PREFILLED_SYRINGE | INTRAVENOUS | Status: DC | PRN
  Administered 2021-12-31: 80 mg via INTRAVENOUS

## 2021-12-31 MED ORDER — DIATRIZOATE MEGLUMINE 30 % UR SOLN
URETHRAL | Status: AC
  Filled 2021-12-31: qty 100

## 2021-12-31 MED ORDER — MIDAZOLAM HCL 5 MG/5ML IJ SOLN
INTRAMUSCULAR | Status: DC | PRN
Start: 2021-12-31 — End: 2021-12-31
  Administered 2021-12-31: 2 mg via INTRAVENOUS

## 2021-12-31 MED ORDER — ONDANSETRON HCL 4 MG/2ML IJ SOLN
INTRAMUSCULAR | Status: AC
  Filled 2021-12-31: qty 8

## 2021-12-31 MED ORDER — CEFAZOLIN SODIUM-DEXTROSE 2-4 GM/100ML-% IV SOLN
2.0000 g | INTRAVENOUS | Status: AC
  Administered 2021-12-31: 2 g via INTRAVENOUS

## 2021-12-31 MED ORDER — DIATRIZOATE MEGLUMINE 30 % UR SOLN
URETHRAL | Status: DC | PRN
  Administered 2021-12-31: 5 mL via URETHRAL

## 2021-12-31 MED ORDER — CEFAZOLIN SODIUM-DEXTROSE 2-4 GM/100ML-% IV SOLN
INTRAVENOUS | Status: AC
  Filled 2021-12-31: qty 100

## 2021-12-31 MED ORDER — LACTATED RINGERS IV SOLN: INTRAVENOUS | Status: DC | PRN

## 2021-12-31 MED ORDER — OXYCODONE-ACETAMINOPHEN 5-325 MG PO TABS: 1.0000 | ORAL_TABLET | ORAL | 0 refills | Status: DC | PRN

## 2021-12-31 MED ORDER — SODIUM CHLORIDE 0.9 % IR SOLN: Status: DC | PRN

## 2021-12-31 SURGICAL SUPPLY — 20 items
BAG DRAIN URO TABLE W/ADPT NS (BAG) ×3 IMPLANT
BAG HAMPER (MISCELLANEOUS) ×3 IMPLANT
CATH INTERMIT  6FR 70CM (CATHETERS) ×3 IMPLANT
CLOTH BEACON ORANGE TIMEOUT ST (SAFETY) ×3 IMPLANT
EXTRACTOR STONE NITINOL NGAGE (UROLOGICAL SUPPLIES) ×2 IMPLANT
GLOVE SURG POLYISO LF SZ8 (GLOVE) ×3 IMPLANT
GLOVE SURG UNDER POLY LF SZ7 (GLOVE) ×6 IMPLANT
GOWN STRL REUS W/TWL LRG LVL3 (GOWN DISPOSABLE) ×3 IMPLANT
GOWN STRL REUS W/TWL XL LVL3 (GOWN DISPOSABLE) ×3 IMPLANT
GUIDEWIRE STR DUAL SENSOR (WIRE) ×3 IMPLANT
GUIDEWIRE STR ZIPWIRE 035X150 (MISCELLANEOUS) ×3 IMPLANT
IV NS IRRIG 3000ML ARTHROMATIC (IV SOLUTION) ×6 IMPLANT
KIT TURNOVER CYSTO (KITS) ×3 IMPLANT
MANIFOLD NEPTUNE II (INSTRUMENTS) ×3 IMPLANT
PACK CYSTO (CUSTOM PROCEDURE TRAY) ×3 IMPLANT
PAD ARMBOARD 7.5X6 YLW CONV (MISCELLANEOUS) ×3 IMPLANT
SYR 10ML LL (SYRINGE) ×3 IMPLANT
SYR CONTROL 10ML LL (SYRINGE) ×3 IMPLANT
TOWEL OR 17X26 4PK STRL BLUE (TOWEL DISPOSABLE) ×3 IMPLANT
WATER STERILE IRR 500ML POUR (IV SOLUTION) ×3 IMPLANT

## 2021-12-31 NOTE — Interval H&P Note (Signed)
History and Physical Interval Note: ? ?12/31/2021 ?7:33 AM ? ?Russell Huynh  has presented today for surgery, with the diagnosis of Right Renal Stone.  The various methods of treatment have been discussed with the patient and family. After consideration of risks, benefits and other options for treatment, the patient has consented to  Procedure(s): ?CYSTOSCOPY WITH RETROGRADE PYELOGRAM, URETEROSCOPY AND STENT PLACEMENT (Right) ?HOLMIUM LASER APPLICATION (Right) as a surgical intervention.  The patient's history has been reviewed, patient examined, no change in status, stable for surgery.  I have reviewed the patient's chart and labs.  Questions were answered to the patient's satisfaction.   ? ? ?[name] ? ? ?

## 2021-12-31 NOTE — Anesthesia Postprocedure Evaluation (Signed)
Anesthesia Post Note ? ?Patient: Russell Huynh ? ?Procedure(s) Performed: CYSTOSCOPY WITH RETROGRADE PYELOGRAM, URETEROSCOPY, BASKET EXTRACTION (Right: Ureter) ? ?Patient location during evaluation: Phase II ?Anesthesia Type: General ?Level of consciousness: awake ?Pain management: pain level controlled ?Vital Signs Assessment: post-procedure vital signs reviewed and stable ?Respiratory status: spontaneous breathing and respiratory function stable ?Cardiovascular status: blood pressure returned to baseline and stable ?Postop Assessment: no headache and no apparent nausea or vomiting ?Anesthetic complications: no ?Comments: Late entry ? ? ?No notable events documented. ? ? ?Last Vitals:  ?Vitals:  ? 12/31/21 0838 12/31/21 0845  ?BP: 115/74 114/62  ?Pulse: 75 66  ?Resp: (!) 22 20  ?Temp: (!) 36.4 ?C   ?SpO2: 100% 97%  ?  ?Last Pain:  ?Vitals:  ? 12/31/21 0855  ?TempSrc:   ?PainSc: 0-No pain  ? ? ?  ?  ?  ?  ?  ?  ? ?Windell Norfolk ? ? ? ? ?

## 2021-12-31 NOTE — Transfer of Care (Signed)
Immediate Anesthesia Transfer of Care Note ? ?Patient: Russell Huynh ? ?Procedure(s) Performed: CYSTOSCOPY WITH RETROGRADE PYELOGRAM, URETEROSCOPY, BASKET EXTRACTION (Right: Ureter) ? ?Patient Location: PACU ? ?Anesthesia Type:General ? ?Level of Consciousness: awake ? ?Airway & Oxygen Therapy: Patient Spontanous Breathing ? ?Post-op Assessment: Report given to RN ? ?Post vital signs: Reviewed and stable ? ?Last Vitals:  ?Vitals Value Taken Time  ?BP 115/74 12/31/21 0837  ?Temp    ?Pulse 63 12/31/21 0838  ?Resp 19 12/31/21 0838  ?SpO2 100 % 12/31/21 0838  ?Vitals shown include unvalidated device data. ? ?Last Pain:  ?Vitals:  ? 12/31/21 0636  ?TempSrc: Oral  ?PainSc: 0-No pain  ?   ? ?  ? ?Complications: No notable events documented. ?

## 2021-12-31 NOTE — Op Note (Addendum)
Preoperative diagnosis: Right ureteral stone ? ?Postoperative diagnosis: Same ? ?Procedure: 1 cystoscopy ?2 right retrograde pyelography ?3.  Intraoperative fluoroscopy, under one hour, with interpretation ?4.  Right ureteroscopic stone manipulation with basket extraction ? ?Attending: Cleda Mccreedy ? ?Anesthesia: General ? ?Estimated blood loss: None ? ?Drains: none ? ?Specimens: stone for analysis ? ?Antibiotics: ancef ? ?Findings: Right mid pole ureteral calculus. No right hydronephrosis. ? ?Indications: Patient is a 26 year old male with a history of a right ureteral stone who has intermittent right flank pain.  After discussing treatment options, he decided proceed with right ureteroscopic stone manipulation. ? ?Procedure in detail: The patient was brought to the operating room and a brief timeout was done to ensure correct patient, correct procedure, correct site.  General anesthesia was administered patient was placed in dorsal lithotomy position.  His genitalia was then prepped and draped in usual sterile fashion.  A rigid 22 French cystoscope was passed in the urethra and the bladder.  Bladder was inspected free masses or lesions.  the ureteral orifices were in the normal orthotopic locations.  a 6 french ureteral catheter was then instilled into the right ureter orifice.  a gentle retrograde was obtained and findings noted above.  we then placed a zip wire through the ureteral catheter and advanced up to the renal pelvis.  we then removed the cystoscope and cannulated the right ureteral orifice with a semirigid ureteroscope.  We encountered no calculus in the ureter. Once we reached the UPJ we then advanced a sensor wire into the renal pelvis. We then removed the scope and advanced a flexible ureteroscope over the sensor wire and up to the renal pelvis. We then performed nephroscopy. We encountered a calculus in the mid pole and it was removed with an NGage basket. We removed the zip wire. We elected  not to place a ureteral stent since this was an uncomplicated ureteroscopy.  the bladder was then drained and this concluded the procedure which was well tolerated by patient. ? ?Complications: None ? ?Condition: Stable, extubated, transferred to PACU ? ?Plan: Patient is to be discharged home as to follow-up in one week  ? ?

## 2021-12-31 NOTE — Anesthesia Preprocedure Evaluation (Signed)
Anesthesia Evaluation  ?Patient identified by MRN, date of birth, ID band ?Patient awake ? ? ? ?Reviewed: ?Allergy & Precautions, H&P , NPO status , Patient's Chart, lab work & pertinent test results, reviewed documented beta blocker date and time  ? ?Airway ?Mallampati: II ? ?TM Distance: >3 FB ?Neck ROM: full ? ? ? Dental ?no notable dental hx. ? ?  ?Pulmonary ?asthma , Current Smoker,  ?  ?Pulmonary exam normal ?breath sounds clear to auscultation ? ? ? ? ? ? Cardiovascular ?Exercise Tolerance: Good ?negative cardio ROS ? ? ?Rhythm:regular Rate:Normal ? ? ?  ?Neuro/Psych ?PSYCHIATRIC DISORDERS Anxiety negative neurological ROS ?   ? GI/Hepatic ?negative GI ROS, Neg liver ROS,   ?Endo/Other  ?negative endocrine ROS ? Renal/GU ?negative Renal ROS  ?negative genitourinary ?  ?Musculoskeletal ? ? Abdominal ?  ?Peds ? Hematology ?negative hematology ROS ?(+) DOES NOT REFUSE BLOOD PRODUCTS,   ?Anesthesia Other Findings ? ? Reproductive/Obstetrics ?negative OB ROS ? ?  ? ? ? ? ? ? ? ? ? ? ? ? ? ?  ?  ? ? ? ? ? ? ? ? ?Anesthesia Physical ?Anesthesia Plan ? ?ASA: 2 ? ?Anesthesia Plan: General and General LMA  ? ?Post-op Pain Management:   ? ?Induction:  ? ?PONV Risk Score and Plan: Ondansetron ? ?Airway Management Planned:  ? ?Additional Equipment:  ? ?Intra-op Plan:  ? ?Post-operative Plan:  ? ?Informed Consent: I have reviewed the patients History and Physical, chart, labs and discussed the procedure including the risks, benefits and alternatives for the proposed anesthesia with the patient or authorized representative who has indicated his/her understanding and acceptance.  ? ? ? ?Dental Advisory Given ? ?Plan Discussed with: CRNA ? ?Anesthesia Plan Comments:   ? ? ? ? ? ? ?Anesthesia Quick Evaluation ? ?

## 2021-12-31 NOTE — Anesthesia Procedure Notes (Signed)
Procedure Name: LMA Insertion ?Date/Time: 12/31/2021 7:51 AM ?Performed by: Moshe Salisbury, CRNA ?Pre-anesthesia Checklist: Patient identified, Patient being monitored, Emergency Drugs available, Timeout performed and Suction available ?Patient Re-evaluated:Patient Re-evaluated prior to induction ?Oxygen Delivery Method: Circle System Utilized ?Preoxygenation: Pre-oxygenation with 100% oxygen ?Induction Type: IV induction ?Ventilation: Mask ventilation without difficulty ?LMA: LMA inserted ?LMA Size: 4.0 ?Number of attempts: 1 ?Placement Confirmation: positive ETCO2 and breath sounds checked- equal and bilateral ? ? ? ? ?

## 2022-01-01 ENCOUNTER — Telehealth: Payer: Self-pay

## 2022-01-01 NOTE — Telephone Encounter (Signed)
Received email from [name] for patient to have work not for 12/13/2021 through 01/21/2022. ? ?Reviewed with Dr. Ronne Binning ?Patient had ureteroscopy on 12/31/2021 with no stent placement. 1 week post op requested by MD. Patient rescheduled post op appt for 01/16/2022. ? ?Per Dr. Ronne Binning patient can be released back to work on 01/12/2022 after original post op date.  ? ?[name] will also fax over paper short term disability paperwork for Dr. Ronne Binning to complete.  ?

## 2022-01-03 ENCOUNTER — Encounter (HOSPITAL_COMMUNITY): Payer: Self-pay | Admitting: Urology

## 2022-01-03 LAB — CALCULI, WITH PHOTOGRAPH (CLINICAL LAB)
Calcium Oxalate Dihydrate: 100 %
Size Calculi: <1 mm
Weight Calculi: <1 mg

## 2022-01-11 ENCOUNTER — Ambulatory Visit: Payer: BC Managed Care – PPO | Admitting: Urology

## 2022-01-14 ENCOUNTER — Encounter: Payer: Self-pay | Admitting: Physician Assistant

## 2022-01-14 ENCOUNTER — Ambulatory Visit (INDEPENDENT_AMBULATORY_CARE_PROVIDER_SITE_OTHER): Payer: BC Managed Care – PPO | Admitting: Urology

## 2022-01-14 ENCOUNTER — Other Ambulatory Visit: Payer: Self-pay

## 2022-01-14 ENCOUNTER — Encounter: Payer: Self-pay | Admitting: Urology

## 2022-01-14 VITALS — BP 120/62 | HR 89

## 2022-01-14 DIAGNOSIS — N200 Calculus of kidney: Secondary | ICD-10-CM | POA: Diagnosis not present

## 2022-01-14 LAB — URINALYSIS, ROUTINE W REFLEX MICROSCOPIC
Bilirubin, UA: NEGATIVE
Glucose, UA: NEGATIVE
Ketones, UA: NEGATIVE
Leukocytes,UA: NEGATIVE
Nitrite, UA: NEGATIVE
Protein,UA: NEGATIVE
RBC, UA: NEGATIVE
Specific Gravity, UA: 1.02 (ref 1.005–1.030)
Urobilinogen, Ur: 0.2 mg/dL (ref 0.2–1.0)
pH, UA: 7 (ref 5.0–7.5)

## 2022-01-14 NOTE — Progress Notes (Signed)
? ?01/14/2022 ?2:58 PM  ? ?Russell Huynh ?1996-10-12 ?[id number] ? ?Referring provider: No referring provider defined for this encounter. ? ?Followup nephrolithiasis ? ? ?HPI: ?Russell Huynh is a 25yo here for followup for nephrolithiasis. No flank pain. No LUTS. No hematuria. No other complaints today ? ? ?PMH: ?Past Medical History:  ?Diagnosis Date  ? Anxiety   ? Asthma   ? sports induced  ? Chest pain   ? History of kidney stones   ? Smoker 12/20/2019  ? ? ?Surgical History: ?Past Surgical History:  ?Procedure Laterality Date  ? CYSTOSCOPY WITH RETROGRADE PYELOGRAM, URETEROSCOPY AND STENT PLACEMENT Right 12/31/2021  ? Procedure: CYSTOSCOPY WITH RETROGRADE PYELOGRAM, URETEROSCOPY, BASKET EXTRACTION;  Surgeon: Malen Gauze, MD;  Location: [facility];  Service: Urology;  Laterality: Right;  ? TONSILLECTOMY    ? TONSILLECTOMY AND ADENOIDECTOMY    ? ? ?Home Medications:  ?Allergies as of 01/14/2022   ? ?   Reactions  ? Pollen Extract   ? ?  ? ?  ?Medication List  ?  ? ?  ? Accurate as of January 14, 2022  2:58 PM. If you have any questions, ask your nurse or doctor.  ?  ?  ? ?  ? ?buPROPion 150 MG 24 hr tablet ?Commonly known as: WELLBUTRIN XL ?Take 150 mg by mouth daily. ?  ?cyclobenzaprine 5 MG tablet ?Commonly known as: FLEXERIL ?Take 5 mg by mouth 3 (three) times daily as needed. ?  ?diclofenac 50 MG EC tablet ?Commonly known as: VOLTAREN ?Take 50 mg by mouth 2 (two) times daily. ?  ?dicyclomine 10 MG capsule ?Commonly known as: BENTYL ?Take 10 mg by mouth every 6 (six) hours as needed. ?  ?HYDROcodone-acetaminophen 5-325 MG tablet ?Commonly known as: NORCO/VICODIN ?Take 1 tablet by mouth every 6 (six) hours as needed. ?  ?ibuprofen 600 MG tablet ?Commonly known as: ADVIL ?Take 1 tablet (600 mg total) by mouth every 8 (eight) hours as needed for mild pain or moderate pain. ?  ?melatonin 1 MG Tabs tablet ?Take 1 mg by mouth at bedtime. ?  ?oxyCODONE-acetaminophen 5-325 MG tablet ?Commonly known as: Percocet ?Take 1 tablet  by mouth every 4 (four) hours as needed. ?  ?tamsulosin 0.4 MG Caps capsule ?Commonly known as: FLOMAX ?Take 1 capsule (0.4 mg total) by mouth daily. ?  ?traMADol 50 MG tablet ?Commonly known as: ULTRAM ?Take by mouth. ?  ?traZODone 50 MG tablet ?Commonly known as: DESYREL ?Take 0.5-1 tablets (25-50 mg total) by mouth at bedtime as needed for sleep. ?  ? ?  ? ? ?Allergies:  ?Allergies  ?Allergen Reactions  ? Pollen Extract   ? ? ?Family History: ?Family History  ?Problem Relation Age of Onset  ? Hypertension Mother   ? Healthy Father   ? Drug abuse Maternal Grandmother   ? ? ?Social History:  reports that he has been smoking cigarettes. He has been smoking an average of 1 pack per day. He has never used smokeless tobacco. He reports current alcohol use. He reports that he does not use drugs. ? ?ROS: ?All other review of systems were reviewed and are negative except what is noted above in HPI ? ?Physical Exam: ?BP 120/62   Pulse 89   ?Constitutional:  Alert and oriented, No acute distress. ?HEENT: [place] AT, moist mucus membranes.  Trachea midline, no masses. ?Cardiovascular: No clubbing, cyanosis, or edema. ?Respiratory: Normal respiratory effort, no increased work of breathing. ?GI: Abdomen is soft, nontender, nondistended, no abdominal masses ?GU:  No CVA tenderness.  ?Lymph: No cervical or inguinal lymphadenopathy. ?Skin: No rashes, bruises or suspicious lesions. ?Neurologic: Grossly intact, no focal deficits, moving all 4 extremities. ?Psychiatric: Normal mood and affect. ? ?Laboratory Data: ?Lab Results  ?Component Value Date  ? WBC 6.2 07/10/2020  ? HGB 15.9 07/10/2020  ? HCT 46.1 07/10/2020  ? MCV 92.2 07/10/2020  ? PLT 137 (L) 07/10/2020  ? ? ?Lab Results  ?Component Value Date  ? CREATININE 0.78 07/10/2020  ? ? ?No results found for: PSA ? ?No results found for: TESTOSTERONE ? ?No results found for: HGBA1C ? ?Urinalysis ?   ?Component Value Date/Time  ? APPEARANCEUR Clear 12/17/2021 1455  ? GLUCOSEU Negative  12/17/2021 1455  ? BILIRUBINUR Negative 12/17/2021 1455  ? PROTEINUR Negative 12/17/2021 1455  ? NITRITE Negative 12/17/2021 1455  ? LEUKOCYTESUR Negative 12/17/2021 1455  ? ? ?Lab Results  ?Component Value Date  ? LABMICR See below: 12/17/2021  ? WBCUA None seen 12/17/2021  ? LABEPIT 0-10 12/17/2021  ? BACTERIA None seen 12/17/2021  ? ? ?Pertinent Imaging: ? ?Results for orders placed during the hospital encounter of 12/17/21 ? ?Abdomen 1 view (KUB) ? ?Narrative ?CLINICAL DATA:  History of nephrolithiasis. ? ?EXAM: ?ABDOMEN - 1 VIEW ? ?COMPARISON:  CT 05/14/2018. ? ?FINDINGS: ?Large amount of stool noted throughout the colon making evaluation ?for nephrolithiasis difficult. Previously identified punctate right ?renal stone not definitely identified. No evidence of ureteral ?stone. No bowel distention or free air. No acute bony abnormality. ? ?IMPRESSION: ?Large amount of stool noted throughout the colon making evaluation ?for nephrolithiasis difficult. Previously identified punctate right ?renal stone not definitely identified. No evidence of ureteral ?stone. ? ? ?Electronically Signed ?By: Maisie Fus  Register M.D. ?On: 12/19/2021 06:07 ? ?No results found for this or any previous visit. ? ?No results found for this or any previous visit. ? ?No results found for this or any previous visit. ? ?No results found for this or any previous visit. ? ?No results found for this or any previous visit. ? ?No results found for this or any previous visit. ? ?No results found for this or any previous visit. ? ? ?Assessment & Plan:   ? ?1. Kidney stones ?-Dietary handout given ?-RTC 6 weeks with renal US ?- Urinalysis, Routine w reflex microscopic ? ? ?No follow-ups on file. ? ?Wilkie Aye, MD ? ?[facility] ?  ?

## 2022-01-14 NOTE — Patient Instructions (Signed)
Dietary Guidelines to Help Prevent Kidney Stones Kidney stones are deposits of minerals and salts that form inside your kidneys. Your risk of developing kidney stones may be greater depending on your diet, your lifestyle, the medicines you take, and whether you have certain medical conditions. Most people can lower their chances of developing kidney stones by following the instructions below. Your dietitian may give you more specific instructions depending on your overall health and the type of kidney stones you tend to develop. What are tips for following this plan? Reading food labels  Choose foods with "no salt added" or "low-salt" labels. Limit your salt (sodium) intake to less than 1,500 mg a day. Choose foods with calcium for each meal and snack. Try to eat about 300 mg of calcium at each meal. Foods that contain 200-500 mg of calcium a serving include: 8 oz (237 mL) of milk, calcium-fortifiednon-dairy milk, and calcium-fortifiedfruit juice. Calcium-fortified means that calcium has been added to these drinks. 8 oz (237 mL) of kefir, yogurt, and soy yogurt. 4 oz (114 g) of tofu. 1 oz (28 g) of cheese. 1 cup (150 g) of dried figs. 1 cup (91 g) of cooked broccoli. One 3 oz (85 g) can of sardines or mackerel. Most people need 1,000-1,500 mg of calcium a day. Talk to your dietitian about how much calcium is recommended for you. Shopping Buy plenty of fresh fruits and vegetables. Most people do not need to avoid fruits and vegetables, even if these foods contain nutrients that may contribute to kidney stones. When shopping for convenience foods, choose: Whole pieces of fruit. Pre-made salads with dressing on the side. Low-fat fruit and yogurt smoothies. Avoid buying frozen meals or prepared deli foods. These can be high in sodium. Look for foods with live cultures, such as yogurt and kefir. Choose high-fiber grains, such as whole-wheat breads, oat bran, and wheat cereals. Cooking Do not add  salt to food when cooking. Place a salt shaker on the table and allow each person to add his or her own salt to taste. Use vegetable protein, such as beans, textured vegetable protein (TVP), or tofu, instead of meat in pasta, casseroles, and soups. Meal planning Eat less salt, if told by your dietitian. To do this: Avoid eating processed or pre-made food. Avoid eating fast food. Eat less animal protein, including cheese, meat, poultry, or fish, if told by your dietitian. To do this: Limit the number of times you have meat, poultry, fish, or cheese each week. Eat a diet free of meat at least 2 days a week. Eat only one serving each day of meat, poultry, fish, or seafood. When you prepare animal protein, cut pieces into small portion sizes. For most meat and fish, one serving is about the size of the palm of your hand. Eat at least five servings of fresh fruits and vegetables each day. To do this: Keep fruits and vegetables on hand for snacks. Eat one piece of fruit or a handful of berries with breakfast. Have a salad and fruit at lunch. Have two kinds of vegetables at dinner. Limit foods that are high in a substance called oxalate. These include: Spinach (cooked), rhubarb, beets, sweet potatoes, and Swiss chard. Peanuts. Potato chips, french fries, and baked potatoes with skin on. Nuts and nut products. Chocolate. If you regularly take a diuretic medicine, make sure to eat at least 1 or 2 servings of fruits or vegetables that are high in potassium each day. These include: Avocado. Banana. Orange, prune,   carrot, or tomato juice. Baked potato. Cabbage. Beans and split peas. Lifestyle  Drink enough fluid to keep your urine pale yellow. This is the most important thing you can do. Spread your fluid intake throughout the day. If you drink alcohol: Limit how much you use to: 0-1 drink a day for women who are not pregnant. 0-2 drinks a day for men. Be aware of how much alcohol is in your  drink. In the [place] one drink equals one 12 oz bottle of beer (355 mL), one 5 oz glass of wine (148 mL), or one 1 oz glass of hard liquor (44 mL). Lose weight if told by your health care provider. Work with your dietitian to find an eating plan and weight loss strategies that work best for you. General information Talk to your health care provider and dietitian about taking daily supplements. You may be told the following depending on your health and the cause of your kidney stones: Not to take supplements with vitamin C. To take a calcium supplement. To take a daily probiotic supplement. To take other supplements such as magnesium, fish oil, or vitamin B6. Take over-the-counter and prescription medicines only as told by your health care provider. These include supplements. What foods should I limit? Limit your intake of the following foods, or eat them as told by your dietitian. Vegetables Spinach. Rhubarb. Beets. Canned vegetables. Pickles. Olives. Baked potatoes with skin. Grains Wheat bran. Baked goods. Salted crackers. Cereals high in sugar. Meats and other proteins Nuts. Nut butters. Large portions of meat, poultry, or fish. Salted, precooked, or cured meats, such as sausages, meat loaves, and hot dogs. Dairy Cheese. Beverages Regular soft drinks. Regular vegetable juice. Seasonings and condiments Seasoning blends with salt. Salad dressings. Soy sauce. Ketchup. Barbecue sauce. Other foods Canned soups. Canned pasta sauce. Casseroles. Pizza. Lasagna. Frozen meals. Potato chips. French fries. The items listed above may not be a complete list of foods and beverages you should limit. Contact a dietitian for more information. What foods should I avoid? Talk to your dietitian about specific foods you should avoid based on the type of kidney stones you have and your overall health. Fruits Grapefruit. The item listed above may not be a complete list of foods and beverages you should  avoid. Contact a dietitian for more information. Summary Kidney stones are deposits of minerals and salts that form inside your kidneys. You can lower your risk of kidney stones by making changes to your diet. The most important thing you can do is drink enough fluid. Drink enough fluid to keep your urine pale yellow. Talk to your dietitian about how much calcium you should have each day, and eat less salt and animal protein as told by your dietitian. This information is not intended to replace advice given to you by your health care provider. Make sure you discuss any questions you have with your health care provider. Document Revised: 09/30/2019 Document Reviewed: 09/30/2019 Elsevier Patient Education  2022 [employer]  

## 2022-01-16 ENCOUNTER — Ambulatory Visit: Payer: BC Managed Care – PPO | Admitting: Urology

## 2022-01-25 ENCOUNTER — Ambulatory Visit: Payer: BC Managed Care – PPO | Admitting: Urology

## 2022-03-08 ENCOUNTER — Ambulatory Visit: Payer: BC Managed Care – PPO | Admitting: Urology

## 2022-03-08 DIAGNOSIS — N200 Calculus of kidney: Secondary | ICD-10-CM

## 2022-03-11 DIAGNOSIS — M25551 Pain in right hip: Secondary | ICD-10-CM | POA: Diagnosis not present

## 2022-03-11 DIAGNOSIS — M25531 Pain in right wrist: Secondary | ICD-10-CM | POA: Diagnosis not present

## 2022-03-25 DIAGNOSIS — W57XXXA Bitten or stung by nonvenomous insect and other nonvenomous arthropods, initial encounter: Secondary | ICD-10-CM | POA: Diagnosis not present

## 2022-03-25 DIAGNOSIS — M25531 Pain in right wrist: Secondary | ICD-10-CM | POA: Diagnosis not present

## 2023-04-16 ENCOUNTER — Other Ambulatory Visit: Payer: Self-pay

## 2023-04-16 ENCOUNTER — Encounter (HOSPITAL_COMMUNITY): Payer: Self-pay

## 2023-04-16 ENCOUNTER — Emergency Department (HOSPITAL_COMMUNITY)
Admission: EM | Admit: 2023-04-16 | Discharge: 2023-04-16 | Disposition: A | Discharge status: Home / Self Care | Payer: Medicaid Other | Attending: Emergency Medicine | Admitting: Emergency Medicine

## 2023-04-16 DIAGNOSIS — M62830 Muscle spasm of back: Secondary | ICD-10-CM

## 2023-04-16 DIAGNOSIS — M5412 Radiculopathy, cervical region: Secondary | ICD-10-CM | POA: Insufficient documentation

## 2023-04-16 DIAGNOSIS — M62838 Other muscle spasm: Secondary | ICD-10-CM | POA: Diagnosis not present

## 2023-04-16 DIAGNOSIS — R200 Anesthesia of skin: Secondary | ICD-10-CM | POA: Diagnosis present

## 2023-04-16 MED ORDER — NAPROXEN 500 MG PO TABS: 500.0000 mg | ORAL_TABLET | Freq: Two times a day (BID) | ORAL | 0 refills | Status: DC

## 2023-04-16 MED ORDER — KETOROLAC TROMETHAMINE 30 MG/ML IJ SOLN
30.0000 mg | Freq: Once | INTRAMUSCULAR | Status: AC
Start: 2023-04-16 — End: 2023-04-16
  Administered 2023-04-16: 30 mg via INTRAVENOUS
  Filled 2023-04-16: qty 1

## 2023-04-16 MED ORDER — METHOCARBAMOL 500 MG PO TABS: 500.0000 mg | ORAL_TABLET | Freq: Two times a day (BID) | ORAL | 0 refills | Status: DC

## 2023-04-16 MED ORDER — METHOCARBAMOL 500 MG PO TABS
500.0000 mg | ORAL_TABLET | Freq: Once | ORAL | Status: AC
  Administered 2023-04-16: 500 mg via ORAL
  Filled 2023-04-16: qty 1

## 2023-04-16 NOTE — ED Provider Notes (Signed)
Russell Huynh EMERGENCY DEPARTMENT AT [facility]  Provider Note  CSN: [id number] Arrival date & time: 04/16/23 0030  History Chief Complaint  Patient presents with   Insect Bite    Russell Huynh is a 27 y.o. male thinks he may have been bitten by an insect on his R shoulder but did not actually see anything bite him. He reports he began having a burning pain in his R shoulder with numbness and aching down his R arm about 4 hours ago. No fevers.    Home Medications Prior to Admission medications   Medication Sig Start Date End Date Taking? Authorizing Provider  methocarbamol (ROBAXIN) 500 MG tablet Take 1 tablet (500 mg total) by mouth 2 (two) times daily. 04/16/23  Yes Pollyann Savoy, MD  naproxen (NAPROSYN) 500 MG tablet Take 1 tablet (500 mg total) by mouth 2 (two) times daily. 04/16/23  Yes Pollyann Savoy, MD  Melatonin 1 MG TABS Take 1 mg by mouth at bedtime.     [provider]  traMADol (ULTRAM) 50 MG tablet Take by mouth. Patient not taking: Reported on 01/14/2022 11/28/21   [provider]     Allergies    Pollen extract   Review of Systems   Review of Systems Please see HPI for pertinent positives and negatives  Physical Exam BP 104/65   Pulse 70   Temp 98 F (36.7 C)   Resp 20   Wt 60 kg   SpO2 98%   BMI 19.53 kg/m   Physical Exam Vitals and nursing note reviewed.  HENT:     Head: Normocephalic.     Nose: Nose normal.  Eyes:     Extraocular Movements: Extraocular movements intact.  Pulmonary:     Effort: Pulmonary effort is normal.  Musculoskeletal:        General: Normal range of motion.     Cervical back: Neck supple.     Comments: No obvious erythema or warmth over the R shoulder. There is one small <1cm macule that could be an insect bite, but he is more tender over the trapezius muscles  Skin:    Findings: No rash (on exposed skin).  Neurological:     General: No focal deficit present.     Mental Status: He is  alert and oriented to person, place, and time.     Cranial Nerves: No cranial nerve deficit.     Sensory: No sensory deficit.     Motor: No weakness.  Psychiatric:        Mood and Affect: Mood normal.     ED Results / Procedures / Treatments   EKG None  Procedures Procedures  Medications Ordered in the ED Medications  ketorolac (TORADOL) 30 MG/ML injection 30 mg (has no administration in time range)  methocarbamol (ROBAXIN) tablet 500 mg (has no administration in time range)    Initial Impression and Plan  Patient here with symptoms most consistent with a cervical radiculopathy, likely from trapezius spasm. He does not have any signs of a complicated insect bite which would be causing these symptoms. Plan Toradol, Robaxin for symptoms. Recommend rest, heat and PCP follow up. RTED for any other concerns.   ED Course       MDM Rules/Calculators/A&P Medical Decision Making Problems Addressed: Cervical radiculopathy: acute illness or injury Spasm of right trapezius muscle: acute illness or injury  Risk Prescription drug management.     Final Clinical Impression(s) / ED Diagnoses Final diagnoses:  Cervical  radiculopathy  Spasm of right trapezius muscle    Rx / DC Orders ED Discharge Orders          Ordered    naproxen (NAPROSYN) 500 MG tablet  2 times daily        04/16/23 0203    methocarbamol (ROBAXIN) 500 MG tablet  2 times daily        04/16/23 0203             Pollyann Savoy, MD 04/16/23 [phone]

## 2023-04-16 NOTE — ED Triage Notes (Signed)
C/o insect bite to right shoulder and having pain and numbness to right arm x 4 hours.

## 2023-06-13 ENCOUNTER — Ambulatory Visit: Payer: Medicaid Other | Admitting: Family Medicine

## 2023-07-18 ENCOUNTER — Ambulatory Visit: Payer: Medicaid Other | Admitting: Family Medicine

## 2023-08-21 ENCOUNTER — Ambulatory Visit: Payer: Medicaid Other | Admitting: Family Medicine

## 2023-08-28 ENCOUNTER — Ambulatory Visit: Payer: Medicaid Other | Admitting: Family Medicine

## 2023-09-11 ENCOUNTER — Ambulatory Visit: Payer: Medicaid Other | Admitting: Family Medicine

## 2023-09-15 ENCOUNTER — Ambulatory Visit: Payer: Medicaid Other | Admitting: Family Medicine

## 2023-11-14 ENCOUNTER — Ambulatory Visit: Payer: Self-pay | Admitting: Family Medicine

## 2023-11-28 ENCOUNTER — Telehealth: Payer: Self-pay | Admitting: Family Medicine

## 2023-11-28 NOTE — Telephone Encounter (Signed)
 Copied from CRM [id number]. Topic: Appointments - Scheduling Inquiry for Clinic >> Nov 28, 2023  9:01 AM Powell B wrote: Reason for CRM: Wife Stevphen calling because patient R shoulder is hurting x4 days, has new patient appointment in March, would like to see if can come sooner. [phone]

## 2023-11-28 NOTE — Telephone Encounter (Signed)
 No sooner NP appts at the moment- wife informed

## 2024-01-15 ENCOUNTER — Ambulatory Visit: Payer: Self-pay | Admitting: Family Medicine

## 2024-01-15 VITALS — BP 112/65 | HR 71 | Ht 69.0 in | Wt 144.1 lb

## 2024-01-15 DIAGNOSIS — Z23 Encounter for immunization: Secondary | ICD-10-CM | POA: Diagnosis not present

## 2024-01-15 DIAGNOSIS — Z136 Encounter for screening for cardiovascular disorders: Secondary | ICD-10-CM

## 2024-01-15 DIAGNOSIS — G479 Sleep disorder, unspecified: Secondary | ICD-10-CM

## 2024-01-15 DIAGNOSIS — Z0001 Encounter for general adult medical examination with abnormal findings: Secondary | ICD-10-CM

## 2024-01-15 DIAGNOSIS — Z1159 Encounter for screening for other viral diseases: Secondary | ICD-10-CM

## 2024-01-15 DIAGNOSIS — Z114 Encounter for screening for human immunodeficiency virus [HIV]: Secondary | ICD-10-CM

## 2024-01-15 DIAGNOSIS — R7301 Impaired fasting glucose: Secondary | ICD-10-CM | POA: Diagnosis not present

## 2024-01-15 DIAGNOSIS — F5104 Psychophysiologic insomnia: Secondary | ICD-10-CM

## 2024-01-15 DIAGNOSIS — E538 Deficiency of other specified B group vitamins: Secondary | ICD-10-CM

## 2024-01-15 DIAGNOSIS — E559 Vitamin D deficiency, unspecified: Secondary | ICD-10-CM | POA: Diagnosis not present

## 2024-01-15 DIAGNOSIS — E038 Other specified hypothyroidism: Secondary | ICD-10-CM

## 2024-01-15 MED ORDER — GABAPENTIN 300 MG PO CAPS: 300.0000 mg | ORAL_CAPSULE | Freq: Every day | ORAL | 2 refills | Status: AC

## 2024-01-15 NOTE — Progress Notes (Signed)
 Complete physical exam  Patient: Russell Huynh   DOB: May 02, 1996   27 y.o. Male  MRN: [id number]  Subjective:    Chief Complaint  Patient presents with   Establish Care    Russell Huynh is a 28 y.o. male who presents today for a complete physical exam. He reports consuming a general diet.  Daily Walks for exercise  He generally feels well. He reports sleeping poorly sleeps around 2-6 hours per night He does have additional problems to discuss today.    Most recent fall risk assessment:    01/15/2024    8:22 AM  Fall Risk   Falls in the past year? 0  Number falls in past yr: 0  Injury with Fall? 0  Risk for fall due to : No Fall Risks  Follow up Falls evaluation completed     Most recent depression screenings:    01/15/2024    8:23 AM 10/16/2020    2:01 PM  PHQ 2/9 Scores  PHQ - 2 Score 1 2  PHQ- 9 Score 5 7    Vision:Not within last year  and Dental: No current dental problems and Receives regular dental care  Patient Care Team: Del Newman Nip, Tenna Child, FNP as PCP - General (Family Medicine) Jodelle Red, MD as PCP - Cardiology (Cardiology)   Outpatient Medications Prior to Visit  Medication Sig   methocarbamol (ROBAXIN) 500 MG tablet Take 1 tablet (500 mg total) by mouth 2 (two) times daily. (Patient not taking: Reported on 01/15/2024)   [DISCONTINUED] Melatonin 1 MG TABS Take 1 mg by mouth at bedtime.  (Patient not taking: Reported on 01/15/2024)   [DISCONTINUED] naproxen (NAPROSYN) 500 MG tablet Take 1 tablet (500 mg total) by mouth 2 (two) times daily. (Patient not taking: Reported on 01/15/2024)   [DISCONTINUED] traMADol (ULTRAM) 50 MG tablet Take by mouth. (Patient not taking: Reported on 01/15/2024)   No facility-administered medications prior to visit.    Review of Systems  Constitutional:  Negative for chills and fever.  HENT:  Negative for tinnitus.   Eyes:  Negative for blurred vision.  Respiratory:  Negative for shortness of breath.    Cardiovascular:  Negative for chest pain.  Gastrointestinal:  Negative for abdominal pain.  Genitourinary:  Negative for dysuria.  Skin:  Negative for itching and rash.  Neurological:  Negative for headaches.  Psychiatric/Behavioral:  The patient has insomnia.        Objective:    BP 112/65   Pulse 71   Ht 5\' 9"  (1.753 m)   Wt 144 lb 1.3 oz (65.4 kg)   SpO2 98%   BMI 21.28 kg/m  BP Readings from Last 3 Encounters:  01/15/24 112/65  04/16/23 104/68  01/14/22 120/62      Physical Exam Vitals reviewed.  Constitutional:      General: He is not in acute distress.    Appearance: Normal appearance. He is not ill-appearing, toxic-appearing or diaphoretic.  HENT:     Head: Normocephalic.     Mouth/Throat:     Mouth: Mucous membranes are moist.  Eyes:     General:        Right eye: No discharge.        Left eye: No discharge.     Conjunctiva/sclera: Conjunctivae normal.     Pupils: Pupils are equal, round, and reactive to light.  Cardiovascular:     Rate and Rhythm: Normal rate.     Pulses: Normal pulses.     Heart sounds:  Normal heart sounds.  Pulmonary:     Effort: Pulmonary effort is normal. No respiratory distress.     Breath sounds: Normal breath sounds.  Abdominal:     General: Bowel sounds are normal.     Palpations: Abdomen is soft.     Tenderness: There is no abdominal tenderness. There is no right CVA tenderness, left CVA tenderness or guarding.  Musculoskeletal:        General: Normal range of motion.  Skin:    General: Skin is warm and dry.     Capillary Refill: Capillary refill takes less than 2 seconds.  Neurological:     Mental Status: He is alert.     Coordination: Coordination normal.     Gait: Gait normal.  Psychiatric:        Mood and Affect: Mood normal.        Behavior: Behavior normal.      No results found for any visits on 01/15/24.    Assessment & Plan:    Routine Health Maintenance and Physical Exam  Immunization History   Administered Date(s) Administered   Influenza,inj,Quad PF,6+ Mos 08/25/2020    Health Maintenance  Topic Date Due   Pneumococcal Vaccine 19-60 Years old (1 of 2 - PCV) Never done   HPV VACCINES (1 - Male 3-dose series) Never done   HIV Screening  Never done   Hepatitis C Screening  Never done   DTaP/Tdap/Td (1 - Tdap) Never done   INFLUENZA VACCINE  05/22/2023   COVID-19 Vaccine (1 - 2024-25 season) 01/31/2024 (Originally 06/22/2023)    Discussed health benefits of physical activity, and encouraged him to engage in regular exercise appropriate for his age and condition.  Encounter for screening for cardiovascular disorders -     Lipid panel -     CMP14+EGFR -     CBC with Differential/Platelet  Need for hepatitis C screening test -     Hepatitis C antibody  Vitamin D deficiency -     VITAMIN D 25 Hydroxy (Vit-D Deficiency, Fractures)  Screening for HIV (human immunodeficiency virus) -     HIV Antibody (routine testing w rflx)  TSH (thyroid-stimulating hormone deficiency) -     TSH + free T4  IFG (impaired fasting glucose) -     Hemoglobin A1c  Vitamin B12 deficiency -     Vitamin B12  Difficulty sleeping -     Gabapentin; Take 1 capsule (300 mg total) by mouth at bedtime.  Dispense: 30 capsule; Refill: 2  Chronic insomnia Assessment & Plan: Failed on Trazodone and Melatonin  Trial on Gabapentin 300 mg at at bedtime  Explained to go to bed at the same time each night and get up at the same time each morning, including on the weekends. Make sure your bedroom is quiet, dark, relaxing, and at a comfortable temperature. Remove electronic devices, such as TVs, computers, and smart phones, from the bedroom.  Follow up in 6 weeks.   Encounter for routine adult physical exam with abnormal findings Assessment & Plan: A comprehensive physical examination was completed, and necessary labs were ordered. Screening and health maintenance recommendations have been updated. The  patient received counseling on exercise and nutrition. BMI was assessed and discussed Advise for heart health, focus on: Eat more fruits and vegetables: Aim for a variety of colors. Choose whole grains: Brown rice, oats, and whole-wheat bread. Limit unhealthy fats: Avoid trans fats; use olive or avocado oil instead. Include lean proteins: Opt for fish, chicken,  beans, and legumes. Reduce sodium: Limit processed foods and add less salt. Stay hydrated: Drink plenty of water. Exercise regularly: Aim for at least 30 minutes of moderate exercise, like walking or cycling, 5 days a week.       Return in about 6 weeks (around 02/26/2024), or if symptoms worsen or fail to improve, for Insomnia.     Cruzita Lederer Newman Nip, FNP

## 2024-01-15 NOTE — Assessment & Plan Note (Signed)

## 2024-01-15 NOTE — Assessment & Plan Note (Addendum)
 Failed on Trazodone and Melatonin  Trial on Gabapentin 300 mg at at bedtime  Explained to go to bed at the same time each night and get up at the same time each morning, including on the weekends. Make sure your bedroom is quiet, dark, relaxing, and at a comfortable temperature. Remove electronic devices, such as TVs, computers, and smart phones, from the bedroom.  Follow up in 6 weeks.

## 2024-01-15 NOTE — Patient Instructions (Signed)

## 2024-01-16 ENCOUNTER — Encounter: Payer: Self-pay | Admitting: Family Medicine

## 2024-01-17 LAB — HEMOGLOBIN A1C
Est. average glucose Bld gHb Est-mCnc: 105 mg/dL
Hgb A1c MFr Bld: 5.3 % (ref 4.8–5.6)

## 2024-01-17 LAB — CMP14+EGFR
ALT: 12 IU/L (ref 0–44)
AST: 14 IU/L (ref 0–40)
Albumin: 4.6 g/dL (ref 4.3–5.2)
Alkaline Phosphatase: 78 IU/L (ref 44–121)
BUN/Creatinine Ratio: 13 (ref 9–20)
BUN: 9 mg/dL (ref 6–20)
Bilirubin Total: 0.4 mg/dL (ref 0.0–1.2)
CO2: 23 mmol/L (ref 20–29)
Calcium: 9.6 mg/dL (ref 8.7–10.2)
Chloride: 103 mmol/L (ref 96–106)
Creatinine, Ser: 0.67 mg/dL — ABNORMAL LOW (ref 0.76–1.27)
Globulin, Total: 2.5 g/dL (ref 1.5–4.5)
Glucose: 72 mg/dL (ref 70–99)
Potassium: 3.9 mmol/L (ref 3.5–5.2)
Sodium: 142 mmol/L (ref 134–144)
Total Protein: 7.1 g/dL (ref 6.0–8.5)
eGFR: 132 mL/min/{1.73_m2} (ref >59)

## 2024-01-17 LAB — CBC WITH DIFFERENTIAL/PLATELET
Basophils Absolute: 0.1 10*3/uL (ref 0.0–0.2)
Basos: 1 %
EOS (ABSOLUTE): 0.2 10*3/uL (ref 0.0–0.4)
Eos: 2 %
Hematocrit: 48.1 % (ref 37.5–51.0)
Hemoglobin: 16.2 g/dL (ref 13.0–17.7)
Immature Grans (Abs): 0 10*3/uL (ref 0.0–0.1)
Immature Granulocytes: 0 %
Lymphocytes Absolute: 2.3 10*3/uL (ref 0.7–3.1)
Lymphs: 31 %
MCH: 30.7 pg (ref 26.6–33.0)
MCHC: 33.7 g/dL (ref 31.5–35.7)
MCV: 91 fL (ref 79–97)
Monocytes Absolute: 0.5 10*3/uL (ref 0.1–0.9)
Monocytes: 7 %
Neutrophils Absolute: 4.5 10*3/uL (ref 1.4–7.0)
Neutrophils: 59 %
Platelets: 238 10*3/uL (ref 150–450)
RBC: 5.27 x10E6/uL (ref 4.14–5.80)
RDW: 12.2 % (ref 11.6–15.4)
WBC: 7.5 10*3/uL (ref 3.4–10.8)

## 2024-01-17 LAB — HIV ANTIBODY (ROUTINE TESTING W REFLEX): HIV Screen 4th Generation wRfx: NONREACTIVE

## 2024-01-17 LAB — HEPATITIS C ANTIBODY: Hep C Virus Ab: NONREACTIVE

## 2024-01-17 LAB — LIPID PANEL
Chol/HDL Ratio: 2.8 ratio (ref 0.0–5.0)
Cholesterol, Total: 94 mg/dL — ABNORMAL LOW (ref 100–199)
HDL: 33 mg/dL — ABNORMAL LOW (ref >39)
LDL Chol Calc (NIH): 46 mg/dL (ref 0–99)
Triglycerides: 68 mg/dL (ref 0–149)
VLDL Cholesterol Cal: 15 mg/dL (ref 5–40)

## 2024-01-17 LAB — TSH+FREE T4
Free T4: 1.33 ng/dL (ref 0.82–1.77)
TSH: 0.901 u[IU]/mL (ref 0.450–4.500)

## 2024-01-17 LAB — VITAMIN B12: Vitamin B-12: 432 pg/mL (ref 232–1245)

## 2024-01-17 LAB — VITAMIN D 25 HYDROXY (VIT D DEFICIENCY, FRACTURES): Vit D, 25-Hydroxy: 24 ng/mL — ABNORMAL LOW (ref 30.0–100.0)

## 2024-03-11 ENCOUNTER — Ambulatory Visit: Admitting: Family Medicine

## 2024-05-28 ENCOUNTER — Ambulatory Visit (HOSPITAL_COMMUNITY)
Admission: RE | Admit: 2024-05-28 | Discharge: 2024-05-28 | Disposition: A | Discharge status: Home / Self Care | Source: Ambulatory Visit | Attending: Family Medicine | Admitting: Family Medicine

## 2024-05-28 ENCOUNTER — Ambulatory Visit: Admitting: Family Medicine

## 2024-05-28 VITALS — BP 135/79 | HR 60 | Ht 69.0 in | Wt 138.0 lb

## 2024-05-28 DIAGNOSIS — M25531 Pain in right wrist: Secondary | ICD-10-CM | POA: Insufficient documentation

## 2024-05-28 DIAGNOSIS — F5104 Psychophysiologic insomnia: Secondary | ICD-10-CM

## 2024-05-28 MED ORDER — MIRTAZAPINE 7.5 MG PO TABS: 7.5000 mg | ORAL_TABLET | Freq: Every day | ORAL | 2 refills | Status: AC

## 2024-05-28 NOTE — Assessment & Plan Note (Signed)
 Xray ordered awaiting results I explained to the patient that non-pharmacological interventions include the application of ice or heat, rest, and recommended range of motion exercises along with gentle stretching. For pain management, Tylenol  was advised. The patient was instructed to follow up if symptoms worsen or persist. The patient verbalized understanding of the care plan, and all questions were answered.

## 2024-05-28 NOTE — Assessment & Plan Note (Signed)
 Failed on Trazodone , Melatonin, Gabapentin    Trial Remeron  7.5 mg  Follow up in 6-8 weeks.

## 2024-05-28 NOTE — Patient Instructions (Signed)

## 2024-05-28 NOTE — Progress Notes (Signed)
 Established Patient Office Visit   Subjective  Patient ID: Russell Huynh, male    DOB: 1996-06-01  Age: 28 y.o. MRN: [id number]  Chief Complaint  Patient presents with   Insomnia    Six week follow up, does not feel the medication is helping    He  has a past medical history of Anxiety, Asthma, Chest pain, History of kidney stones, and Smoker (12/20/2019).  The patient presents with right wrist stiffness and inability to bend the wrist, ongoing for approximately two months. There was no known mechanism of injury. He describes the pain as aching and stabbing, with radiation to the right arm. Pain is constant and rated 10/10 in severity. Associated symptoms include muscle weakness; however, he denies any numbness or tingling. The pain is aggravated by movement and palpation. The patient has not attempted any treatments or interventions for relief    Review of Systems  Constitutional:  Negative for chills and fever.  Respiratory:  Negative for shortness of breath.   Cardiovascular:  Negative for chest pain.  Gastrointestinal:  Negative for abdominal pain.  Neurological:  Negative for tingling.  Psychiatric/Behavioral:  The patient has insomnia.       Objective:     BP 135/79   Pulse 60   Ht 5' 9 (1.753 m)   Wt 138 lb (62.6 kg)   SpO2 98%   BMI 20.38 kg/m  BP Readings from Last 3 Encounters:  05/28/24 135/79  01/15/24 112/65  04/16/23 104/68      Physical Exam Vitals reviewed.  Constitutional:      General: He is not in acute distress.    Appearance: Normal appearance. He is not ill-appearing, toxic-appearing or diaphoretic.  HENT:     Head: Normocephalic.  Eyes:     General:        Right eye: No discharge.        Left eye: No discharge.     Conjunctiva/sclera: Conjunctivae normal.  Cardiovascular:     Rate and Rhythm: Normal rate.     Pulses: Normal pulses.     Heart sounds: Normal heart sounds.  Pulmonary:     Effort: Pulmonary effort is normal. No  respiratory distress.     Breath sounds: Normal breath sounds.  Musculoskeletal:        General: Tenderness present.  Skin:    General: Skin is warm and dry.  Neurological:     Mental Status: He is alert.  Psychiatric:        Mood and Affect: Mood normal.        Behavior: Behavior normal.      No results found for any visits on 05/28/24.  The ASCVD Risk score (Arnett DK, et al., 2019) failed to calculate for the following reasons:   The 2019 ASCVD risk score is only valid for ages 15 to 25    Assessment & Plan:  Right wrist pain Assessment & Plan: Xray ordered awaiting results I explained to the patient that non-pharmacological interventions include the application of ice or heat, rest, and recommended range of motion exercises along with gentle stretching. For pain management, Tylenol  was advised. The patient was instructed to follow up if symptoms worsen or persist. The patient verbalized understanding of the care plan, and all questions were answered.   Orders: -     DG Wrist Complete Right; Future  Chronic insomnia Assessment & Plan: Failed on Trazodone , Melatonin, Gabapentin    Trial Remeron  7.5 mg  Follow up in 6-8 weeks.  Other orders -     Mirtazapine ; Take 1 tablet (7.5 mg total) by mouth at bedtime.  Dispense: 30 tablet; Refill: 2    Return in about 8 weeks (around 07/23/2024), or if symptoms worsen or fail to improve, for Follow up.   Hilario Kidd Wilhelmena Falter, FNP

## 2024-07-26 ENCOUNTER — Ambulatory Visit

## 2024-07-26 VITALS — BP 125/80 | HR 69 | Ht 68.0 in | Wt 138.0 lb

## 2024-07-26 DIAGNOSIS — F5104 Psychophysiologic insomnia: Secondary | ICD-10-CM | POA: Diagnosis not present

## 2024-07-26 NOTE — Progress Notes (Unsigned)
 Established Patient Office Visit  Subjective   Patient ID: Russell Huynh, male    DOB: 12/20/1995  Age: 28 y.o. MRN: [id number]  Chief Complaint  Patient presents with   Medical Management of Chronic Issues    Pt here for a follow up     HPI Discussed the use of AI scribe software for clinical note transcription with the patient, who gave verbal consent to proceed.  History of Present Illness   Russell Huynh is a 28 year old male who presents with sleep issues and concerns about bumps on his head.  Sleep disturbance - Currently taking trazodone , which has improved sleep quality - Experiences some difficulty initiating sleep - Able to maintain sleep once asleep - Previously had difficulty both falling asleep and staying asleep, with frequent nighttime awakenings - Now sleeps through the night, but is unsure if sleep is comfortable  Scalp lesions - Flesh-colored, small bumps on the scalp - Lesions are non-tender and not painful - Some bumps have been present since childhood - Some lesions are new, but do not appear to be increasing in size or causing discomfort - Lesions were noticed by his wife during hair trimming  Cardiopulmonary symptoms - No chest pain, shortness of breath, or palpitations - Blood pressure is well controlled      Patient Active Problem List   Diagnosis Date Noted   Right wrist pain 05/28/2024   Encounter for routine adult physical exam with abnormal findings 01/15/2024   Chronic insomnia 10/16/2020   Smoker 12/20/2019   Abnormal ECG 11/02/2013   Vasovagal syncope 08/31/2013    ROS    Objective:     BP 125/80   Pulse 69   Ht 5' 8 (1.727 m)   Wt 138 lb (62.6 kg)   SpO2 98%   BMI 20.98 kg/m  BP Readings from Last 3 Encounters:  07/26/24 125/80  05/28/24 135/79  01/15/24 112/65   Wt Readings from Last 3 Encounters:  07/26/24 138 lb (62.6 kg)  05/28/24 138 lb (62.6 kg)  01/15/24 144 lb 1.3 oz (65.4 kg)     Physical Exam Vitals and  nursing note reviewed.  Constitutional:      Appearance: Normal appearance.  HENT:     Head: Normocephalic.  Eyes:     Extraocular Movements: Extraocular movements intact.     Pupils: Pupils are equal, round, and reactive to light.  Cardiovascular:     Rate and Rhythm: Normal rate and regular rhythm.  Pulmonary:     Effort: Pulmonary effort is normal.     Breath sounds: Normal breath sounds.  Musculoskeletal:     Cervical back: Normal range of motion and neck supple.  Neurological:     Mental Status: He is alert and oriented to person, place, and time.  Psychiatric:        Mood and Affect: Mood normal.        Thought Content: Thought content normal.      No results found for any visits on 07/26/24.    The ASCVD Risk score (Arnett DK, et al., 2019) failed to calculate for the following reasons:   The 2019 ASCVD risk score is only valid for ages 30 to 58    Assessment & Plan:   Problem List Items Addressed This Visit       Other   Chronic insomnia - Primary   Improved sleep with trazodone , though difficulty falling asleep persists. Maintains sleep once achieved. - Continue trazodone  as prescribed. -  Instructed to reach out for a refill when needed.      Return in about 6 months (around 01/24/2025).    Leita Longs, FNP

## 2024-07-29 NOTE — Assessment & Plan Note (Signed)
 Improved sleep with trazodone , though difficulty falling asleep persists. Maintains sleep once achieved. - Continue trazodone  as prescribed. - Instructed to reach out for a refill when needed.

## 2025-01-25 ENCOUNTER — Ambulatory Visit
